# Patient Record
Sex: Female | Born: 1940 | Race: White | Hispanic: No | Marital: Married | Smoking: Former smoker
Health system: Southern US, Community
[De-identification: ages and names within clinical notes are randomized; demographics above are authoritative.]

## PROBLEM LIST (undated history)

## (undated) DIAGNOSIS — K602 Anal fissure, unspecified: Secondary | ICD-10-CM

## (undated) DIAGNOSIS — M1990 Unspecified osteoarthritis, unspecified site: Secondary | ICD-10-CM

## (undated) DIAGNOSIS — R5381 Other malaise: Secondary | ICD-10-CM

## (undated) DIAGNOSIS — M150 Primary generalized (osteo)arthritis: Secondary | ICD-10-CM

## (undated) DIAGNOSIS — G4700 Insomnia, unspecified: Secondary | ICD-10-CM

## (undated) DIAGNOSIS — I10 Essential (primary) hypertension: Secondary | ICD-10-CM

## (undated) DIAGNOSIS — E785 Hyperlipidemia, unspecified: Secondary | ICD-10-CM

## (undated) DIAGNOSIS — M5136 Other intervertebral disc degeneration, lumbar region with discogenic back pain only: Secondary | ICD-10-CM

## (undated) DIAGNOSIS — M8580 Other specified disorders of bone density and structure, unspecified site: Secondary | ICD-10-CM

## (undated) DIAGNOSIS — I471 Supraventricular tachycardia, unspecified: Secondary | ICD-10-CM

## (undated) DIAGNOSIS — E559 Vitamin D deficiency, unspecified: Secondary | ICD-10-CM

## (undated) DIAGNOSIS — K219 Gastro-esophageal reflux disease without esophagitis: Secondary | ICD-10-CM

## (undated) DIAGNOSIS — F419 Anxiety disorder, unspecified: Secondary | ICD-10-CM

## (undated) DIAGNOSIS — N1832 Chronic kidney disease, stage 3b: Secondary | ICD-10-CM

## (undated) DIAGNOSIS — H269 Unspecified cataract: Secondary | ICD-10-CM

## (undated) DIAGNOSIS — N3010 Interstitial cystitis (chronic) without hematuria: Secondary | ICD-10-CM

## (undated) DIAGNOSIS — J4520 Mild intermittent asthma, uncomplicated: Secondary | ICD-10-CM

## (undated) DIAGNOSIS — R739 Hyperglycemia, unspecified: Secondary | ICD-10-CM

## (undated) HISTORY — DX: Hyperlipidemia, unspecified: E78.5

## (undated) HISTORY — DX: Essential (primary) hypertension: I10

## (undated) HISTORY — DX: Primary generalized (osteo)arthritis: M15.0

## (undated) HISTORY — DX: Anal fissure, unspecified: K60.2

## (undated) HISTORY — DX: Hyperglycemia, unspecified: R73.9

## (undated) HISTORY — PX: ESOPHAGOGASTRODUODENOSCOPY: SHX1529

## (undated) HISTORY — DX: Interstitial cystitis (chronic) without hematuria: N30.10

## (undated) HISTORY — DX: Unspecified cataract: H26.9

## (undated) HISTORY — DX: Insomnia, unspecified: G47.00

## (undated) HISTORY — PX: BREAST SURGERY: SHX581

## (undated) HISTORY — DX: Gastro-esophageal reflux disease without esophagitis: K21.9

## (undated) HISTORY — DX: Unspecified osteoarthritis, unspecified site: M19.90

## (undated) HISTORY — DX: Mild intermittent asthma, uncomplicated: J45.20

## (undated) HISTORY — DX: Anxiety disorder, unspecified: F41.9

## (undated) HISTORY — DX: Other intervertebral disc degeneration, lumbar region with discogenic back pain only: M51.360

## (undated) HISTORY — DX: Supraventricular tachycardia, unspecified: I47.10

## (undated) HISTORY — DX: Chronic kidney disease, stage 3b: N18.32

## (undated) HISTORY — DX: Other specified disorders of bone density and structure, unspecified site: M85.80

## (undated) HISTORY — DX: Other fatigue: R53.81

## (undated) HISTORY — DX: Vitamin D deficiency, unspecified: E55.9

---
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
## 1986-12-11 HISTORY — PX: ABDOMINAL HYSTERECTOMY: SHX81

## 1998-09-21 ENCOUNTER — Ambulatory Visit (HOSPITAL_BASED_OUTPATIENT_CLINIC_OR_DEPARTMENT_OTHER): Admission: RE | Admit: 1998-09-21 | Discharge: 1998-09-21 | Payer: Self-pay | Admitting: Urology

## 2002-06-18 ENCOUNTER — Encounter: Payer: Self-pay | Admitting: Urology

## 2002-06-18 ENCOUNTER — Encounter: Admission: RE | Admit: 2002-06-18 | Discharge: 2002-06-18 | Payer: Self-pay | Admitting: Urology

## 2011-02-24 ENCOUNTER — Encounter (INDEPENDENT_AMBULATORY_CARE_PROVIDER_SITE_OTHER): Payer: Self-pay | Admitting: *Deleted

## 2011-02-28 NOTE — Letter (Signed)
Summary: New Patient letter  [facility]  [address] [place], [place] [address]   Phone: [phone]  Fax: [phone]       02/24/2011 MRN: [id number]  [place], [place]  [address]  Dear [name],  Welcome to the Gastroenterology Division at [facility].    You are scheduled to see Dr.  Fuller Plan on 04-06-11 at 10:15A.M. on the 3rd floor at [facility], [place] [employer].  We ask that you try to arrive at our office 15 minutes prior to your appointment time to allow for check-in.  We would like you to complete the enclosed self-administered evaluation form prior to your visit and bring it with you on the day of your appointment.  We will review it with you.  Also, please bring a complete list of all your medications or, if you prefer, bring the medication bottles and we will list them.  Please bring your insurance card so that we may make a copy of it.  If your insurance requires a referral to see a specialist, please bring your referral form from your primary care physician.  Co-payments are due at the time of your visit and may be paid by cash, check or credit card.     Your office visit will consist of a consult with your physician (includes a physical exam), any laboratory testing he/she may order, scheduling of any necessary diagnostic testing (e.g. x-ray, ultrasound, CT-scan), and scheduling of a procedure (e.g. Endoscopy, Colonoscopy) if required.  Please allow enough time on your schedule to allow for any/all of these possibilities.    If you cannot keep your appointment, please call [phone] to cancel or reschedule prior to your appointment date.  This allows [place] the opportunity to schedule an appointment for another patient in need of care.  If you do not cancel or reschedule by 5 p.m. the business day prior to your appointment date, you will be charged a $50.00 late cancellation/no-show fee.    Thank you for choosing [facility] for your medical needs.  We appreciate the opportunity to care for you.  Please visit [place] at our website  to learn more about our practice.                     Sincerely,                                                             The Gastroenterology Division

## 2011-04-06 ENCOUNTER — Encounter: Payer: Self-pay | Admitting: Gastroenterology

## 2011-04-06 ENCOUNTER — Ambulatory Visit (INDEPENDENT_AMBULATORY_CARE_PROVIDER_SITE_OTHER): Payer: Medicare Other | Admitting: Gastroenterology

## 2011-04-06 VITALS — BP 118/72 | HR 72 | Ht 61.0 in | Wt 153.4 lb

## 2011-04-06 DIAGNOSIS — K219 Gastro-esophageal reflux disease without esophagitis: Secondary | ICD-10-CM

## 2011-04-06 DIAGNOSIS — Z1211 Encounter for screening for malignant neoplasm of colon: Secondary | ICD-10-CM

## 2011-04-06 NOTE — Patient Instructions (Signed)
You have been scheduled for a Upper Endoscopy and separate instruction sheet given.  Patient advised to avoid spicy, acidic, citrus, chocolate, mints, fruit and fruit juices.  Limit the intake of caffeine, alcohol and Soda.  Don't exercise too soon after eating.  Don't lie down within 3-4 hours of eating.  Elevate the head of your bed. Colon caner and polyp brochure given.  Cc: Gilford Rile, MD

## 2011-04-06 NOTE — Progress Notes (Signed)
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 She relates no prior problems with reflux until approximately 6 months ago when she noted frequent heartburn and regurgitation. Her symptoms worsened after Christmas and she started Prilosec daily and then twice daily without good control of symptoms. She was recently placed on pantoprazole 40 mg daily and her symptoms have come under excellent control. She denies dysphagia, odynophagia, chest pain, weight loss, nausea, vomiting, abdominal pain, melena, hematochezia, change in stool caliber. Her last colonoscopy was in 1999 and was normal.  Past Medical History  Diagnosis Date  . GERD (gastroesophageal reflux disease)   . Hypertension   . Interstitial cystitis    Past Surgical History  Procedure Date  . Appendectomy 1947  . Abdominal hysterectomy 1988  . Breast surgery     benign tumor removed    reports that she has never smoked. She has never used smokeless tobacco. She reports that she drinks alcohol. She reports that she does not use illicit drugs. family history includes Colon cancer in her paternal grandfather; Colon polyps in her father; and Heart disease in her mother. No Known Allergies  Outpatient Encounter Prescriptions as of 04/06/2011  Medication Sig Dispense Refill  . estradiol (ESTRACE) 0.1 MG/GM vaginal cream Place 2 g vaginally as needed.        . hydrochlorothiazide (,MICROZIDE/HYDRODIURIL,) 12.5 MG capsule Take 12.5 mg by mouth daily.        Marland Kitchen lisinopril (PRINIVIL,ZESTRIL) 5 MG tablet Take 5 mg by mouth daily.        . pantoprazole (PROTONIX) 40 MG tablet Take 40 mg by mouth daily.        Marland Kitchen PARoxetine (PAXIL-CR) 12.5 MG 24 hr tablet Take 12.5 mg by mouth every morning.         Review of Systems: Pertinent positive and negative review of systems were noted in the above HPI section. All other review of systems were otherwise negative.  Physical  Exam: General: Well developed , well nourished, no acute distress Head: Normocephalic and atraumatic Eyes:  sclerae anicteric, EOMI Ears: Normal auditory acuity Mouth: No deformity or lesions Neck: Supple, no masses or thyromegaly Lungs: Clear throughout to auscultation Heart: Regular rate and rhythm; no murmurs, rubs or bruits Abdomen: Soft, non tender and non distended. No masses, hepatosplenomegaly or hernias noted. Normal Bowel sounds Musculoskeletal: Symmetrical with no gross deformities  Skin: No lesions on visible extremities Pulses:  Normal pulses noted Extremities: No clubbing, cyanosis, edema or deformities noted Neurological: Alert oriented x 4, grossly nonfocal Cervical Nodes:  No significant cervical adenopathy Inguinal Nodes: No significant inguinal adenopathy Psychological:  Alert and cooperative. Normal mood and affect  Assessment and Recommendations:  1. GERD. Rule out ulcer disease, esophagitis and less likely upper gastrointestinal tract neoplasms. Standard anti-reflux measures. Continue pantoprazole 40 mg every morning. The risks, benefits, and alternatives to endoscopy with possible biopsy and possible dilation were discussed with the patient and they consent to proceed.   2. Routine colorectal cancer screening. The patient is overdue for average risk colorectal cancer screening. She declines colonoscopy at this time and states she will reconsider in the future.

## 2011-04-07 ENCOUNTER — Encounter: Payer: Self-pay | Admitting: Gastroenterology

## 2011-05-04 ENCOUNTER — Other Ambulatory Visit: Payer: Medicare Other | Admitting: Gastroenterology

## 2011-06-06 ENCOUNTER — Other Ambulatory Visit: Payer: Medicare Other | Admitting: Gastroenterology

## 2011-07-14 ENCOUNTER — Ambulatory Visit (AMBULATORY_SURGERY_CENTER): Payer: Medicare Other | Admitting: Gastroenterology

## 2011-07-14 ENCOUNTER — Encounter: Payer: Self-pay | Admitting: Gastroenterology

## 2011-07-14 VITALS — BP 145/60 | HR 79 | Temp 98.9°F | Resp 16 | Ht 61.0 in | Wt 153.0 lb

## 2011-07-14 DIAGNOSIS — K2990 Gastroduodenitis, unspecified, without bleeding: Secondary | ICD-10-CM

## 2011-07-14 DIAGNOSIS — K219 Gastro-esophageal reflux disease without esophagitis: Secondary | ICD-10-CM

## 2011-07-14 DIAGNOSIS — K2940 Chronic atrophic gastritis without bleeding: Secondary | ICD-10-CM

## 2011-07-14 MED ORDER — SODIUM CHLORIDE 0.9 % IV SOLN: 500.0000 mL | INTRAVENOUS | Status: DC

## 2011-07-14 NOTE — Patient Instructions (Signed)
Please refer to your blue and neon green sheets for instructions regarding diet and activity for the rest of today.  You may resume your medications as you would normally take them.  

## 2011-07-17 ENCOUNTER — Telehealth: Payer: Self-pay | Admitting: *Deleted

## 2011-07-17 NOTE — Telephone Encounter (Signed)

## 2011-07-22 ENCOUNTER — Encounter: Payer: Self-pay | Admitting: Gastroenterology

## 2013-04-23 ENCOUNTER — Other Ambulatory Visit: Payer: Self-pay | Admitting: Internal Medicine

## 2013-04-23 DIAGNOSIS — Z1231 Encounter for screening mammogram for malignant neoplasm of breast: Secondary | ICD-10-CM

## 2013-05-15 ENCOUNTER — Ambulatory Visit: Payer: Medicare Other

## 2014-02-24 ENCOUNTER — Encounter: Payer: Self-pay | Admitting: Gastroenterology

## 2014-04-14 ENCOUNTER — Encounter: Payer: Medicare Other | Admitting: Gastroenterology

## 2014-05-08 ENCOUNTER — Ambulatory Visit (AMBULATORY_SURGERY_CENTER): Payer: Self-pay

## 2014-05-08 VITALS — Ht 60.0 in | Wt 149.0 lb

## 2014-05-08 DIAGNOSIS — Z800 Family history of malignant neoplasm of digestive organs: Secondary | ICD-10-CM

## 2014-05-08 MED ORDER — MOVIPREP 100 G PO SOLR: 1.0000 | Freq: Once | ORAL | Status: DC

## 2014-05-08 NOTE — Progress Notes (Signed)
No allergies to eggs or soy No home oxygen No past problems with anesthesia No diet/weight loss meds Has email  Emmi instructions given for colonoscopy

## 2014-05-22 ENCOUNTER — Encounter: Payer: Medicare Other | Admitting: Gastroenterology

## 2014-07-16 ENCOUNTER — Ambulatory Visit (AMBULATORY_SURGERY_CENTER): Payer: Medicare Other | Admitting: Gastroenterology

## 2014-07-16 ENCOUNTER — Encounter: Payer: Self-pay | Admitting: Gastroenterology

## 2014-07-16 VITALS — BP 131/78 | HR 66 | Temp 97.8°F | Resp 15 | Ht 60.0 in | Wt 149.0 lb

## 2014-07-16 DIAGNOSIS — Z1211 Encounter for screening for malignant neoplasm of colon: Secondary | ICD-10-CM

## 2014-07-16 MED ORDER — SODIUM CHLORIDE 0.9 % IV SOLN: 500.0000 mL | INTRAVENOUS | Status: DC

## 2014-07-16 NOTE — Progress Notes (Signed)
Report to PACU, RN, vss, BBS= Clear.  

## 2014-07-16 NOTE — Patient Instructions (Signed)
Impressions/recommendations:  Normal colon. Hemorrhoids (handout given)  YOU HAD AN ENDOSCOPIC PROCEDURE TODAY AT [facility]: Refer to the procedure report that was given to you for any specific questions about what was found during the examination.  If the procedure report does not answer your questions, please call your gastroenterologist to clarify.  If you requested that your care partner not be given the details of your procedure findings, then the procedure report has been included in a sealed envelope for you to review at your convenience later.  YOU SHOULD EXPECT: Some feelings of bloating in the abdomen. Passage of more gas than usual.  Walking can help get rid of the air that was put into your GI tract during the procedure and reduce the bloating. If you had a lower endoscopy (such as a colonoscopy or flexible sigmoidoscopy) you may notice spotting of blood in your stool or on the toilet paper. If you underwent a bowel prep for your procedure, then you may not have a normal bowel movement for a few days.  DIET: Your first meal following the procedure should be a light meal and then it is ok to progress to your normal diet.  A half-sandwich or bowl of soup is an example of a good first meal.  Heavy or fried foods are harder to digest and may make you feel nauseous or bloated.  Likewise meals heavy in dairy and vegetables can cause extra gas to form and this can also increase the bloating.  Drink plenty of fluids but you should avoid alcoholic beverages for 24 hours.  ACTIVITY: Your care partner should take you home directly after the procedure.  You should plan to take it easy, moving slowly for the rest of the day.  You can resume normal activity the day after the procedure however you should NOT DRIVE or use heavy machinery for 24 hours (because of the sedation medicines used during the test).    SYMPTOMS TO REPORT IMMEDIATELY: A gastroenterologist can be reached at any  hour.  During normal business hours, 8:30 AM to 5:00 PM Monday through Friday, call [phone].  After hours and on weekends, please call the GI answering service at [phone] who will take a message and have the physician on call contact you.   Following lower endoscopy (colonoscopy or flexible sigmoidoscopy):  Excessive amounts of blood in the stool  Significant tenderness or worsening of abdominal pains  Swelling of the abdomen that is new, acute  Fever of 100F or higher   FOLLOW UP: If any biopsies were taken you will be contacted by phone or by letter within the next 1-3 weeks.  Call your gastroenterologist if you have not heard about the biopsies in 3 weeks.  Our staff will call the home number listed on your records the next business day following your procedure to check on you and address any questions or concerns that you may have at that time regarding the information given to you following your procedure. This is a courtesy call and so if there is no answer at the home number and we have not heard from you through the emergency physician on call, we will assume that you have returned to your regular daily activities without incident.  SIGNATURES/CONFIDENTIALITY: You and/or your care partner have signed paperwork which will be entered into your electronic medical record.  These signatures attest to the fact that that the information above on your After Visit Summary has been reviewed and is  understood.  Full responsibility of the confidentiality of this discharge information lies with you and/or your care-partner.

## 2014-07-16 NOTE — Op Note (Signed)
[place]  [address] [place], [address]   COLONOSCOPY PROCEDURE REPORT  PATIENT: Jenna Poole, Jenna Poole  MR#: [id number] BIRTHDATE: 12/13/1940 , 73  yrs. old GENDER: Female ENDOSCOPIST: Ladene Artist, MD, [facility] REFERRED CN:OBSJ Bea [place], M.D. PROCEDURE DATE:  07/16/2014 PROCEDURE:   Colonoscopy, screening First Screening Colonoscopy - Avg.  risk and is 50 yrs.  old or older - No.  Prior Negative Screening - Now for repeat screening. 10 or more years since last screening  History of Adenoma - Now for follow-up colonoscopy & has been > or = to 3 yrs.  N/A  Polyps Removed Today? No.  Recommend repeat exam, <10 yrs? No. ASA CLASS:   Class II INDICATIONS:average risk screening. MEDICATIONS: MAC sedation, administered by CRNA and propofol (Diprivan) 322m IV DESCRIPTION OF PROCEDURE:   After the risks benefits and alternatives of the procedure were thoroughly explained, informed consent was obtained.  A digital rectal exam revealed no abnormalities of the rectum.   The LB [id number] endoscope was introduced through the anus and advanced to the cecum, which was identified by both the appendix and ileocecal valve. No adverse events experienced with a tortuous sigmoid colon, moderately difficulty advancing around the sigmoid colon.   The quality of the prep was good, using MoviPrep  The instrument was then slowly withdrawn as the colon was fully examined.  CLON FINDINGS: A normal appearing cecum, ileocecal valve, and appendiceal orifice were identified.  The ascending, hepatic flexure, transverse, splenic flexure, descending, sigmoid colon and rectum appeared unremarkable.  No polyps or cancers were seen. Retroflexed views revealed small external hemorrhoids. The time to cecum=8 minutes 25 seconds.  Withdrawal time=9 minutes 31 seconds. The scope was withdrawn and the procedure completed. COMPLICATIONS: There were no complications.  ENDOSCOPIC IMPRESSION: 1.   Normal colon 2.  Small external hemorrhoids  RECOMMENDATIONS: 1.  Given your age, you will not need another colonoscopy for colon cancer screening or polyp surveillance.  These types of tests usually stop around the age 73  eSigned:  MLadene Artist MD, [facility]08/05/2014 12:29 PM

## 2014-07-16 NOTE — Progress Notes (Signed)
Two open areas on right hand. Patient stated skin peeling.

## 2014-07-17 ENCOUNTER — Telehealth: Payer: Self-pay

## 2014-07-17 NOTE — Telephone Encounter (Signed)
  Follow up Call-  Call back number 07/16/2014  Post procedure Call Back phone  # [phone]  Permission to leave phone message Yes     Patient questions:  Do you have a fever, pain , or abdominal swelling? No. Pain Score  0 *  Have you tolerated food without any problems? Yes.    Have you been able to return to your normal activities? Yes.    Do you have any questions about your discharge instructions: Diet   No. Medications  No. Follow up visit  No.  Do you have questions or concerns about your Care? No.  Actions: * If pain score is 4 or above: No action needed, pain <4.  No problems per the pt. maw

## 2015-04-29 ENCOUNTER — Telehealth: Payer: Self-pay | Admitting: Gastroenterology

## 2015-04-29 NOTE — Telephone Encounter (Signed)
Patient with worsening heart burn and dysphagia.  Similar to the episodes she had in 2012.  She is scheduled to come in and see Dr. Fuller Plan on 05/04/15 3:00

## 2015-05-04 ENCOUNTER — Ambulatory Visit (INDEPENDENT_AMBULATORY_CARE_PROVIDER_SITE_OTHER): Payer: Medicare Other | Admitting: Gastroenterology

## 2015-05-04 ENCOUNTER — Encounter: Payer: Self-pay | Admitting: Gastroenterology

## 2015-05-04 VITALS — BP 136/68 | HR 68 | Ht 59.0 in | Wt 144.5 lb

## 2015-05-04 DIAGNOSIS — K219 Gastro-esophageal reflux disease without esophagitis: Secondary | ICD-10-CM

## 2015-05-04 MED ORDER — PANTOPRAZOLE SODIUM 40 MG PO TBEC: 40.0000 mg | DELAYED_RELEASE_TABLET | Freq: Two times a day (BID) | ORAL | Status: DC

## 2015-05-04 NOTE — Patient Instructions (Signed)
Increase your pantoprazole 40 mg to one tablet by mouth twice daily. A new prescription has been sent to your pharmacy.   Please call back in 4 weeks if your symptoms have not improved.   Thank you for choosing me and [facility].  Pricilla Riffle. Dagoberto Ligas., MD., Marval Regal

## 2015-05-04 NOTE — Progress Notes (Signed)
    History of Present Illness: This is a 74 year old female with a history of GERD. She states her reflux symptoms have worsened over the past few months despite remaining on pantoprazole 40 mg daily. She admits that she is not carefully follow antireflux diet. She notes it when she more closely follows a diet her symptoms improve and when she uses Zantac in the evening for second pantoprazole evening her symptoms improve. Colonoscopy 07/2014 showed only external hemorrhoids. EGD in 07/2011 showed gastritis.   Current Medications, Allergies, Past Medical History, Past Surgical History, Family History and Social History were reviewed in [employer] record.  Physical Exam: General: Well developed , well nourished, no acute distress Head: Normocephalic and atraumatic Eyes:  sclerae anicteric, EOMI Ears: Normal auditory acuity Mouth: No deformity or lesions Lungs: Clear throughout to auscultation Heart: Regular rate and rhythm; no murmurs, rubs or bruits Abdomen: Soft, non tender and non distended. No masses, hepatosplenomegaly or hernias noted. Normal Bowel sounds Musculoskeletal: Symmetrical with no gross deformities  Pulses:  Normal pulses noted Extremities: No clubbing, cyanosis, edema or deformities noted Neurological: Alert oriented x 4, grossly nonfocal Psychological:  Alert and cooperative. Normal mood and affect  Assessment and Recommendations:  1. GERD. Symptoms not adequately controlled. Intensify all antireflux measures. Increase pantoprazole to 40 mg twice a day for one month and if symptoms are under excellent control she can resume pantoprazole daily. Symptoms are not adequately controlled consider upper endoscopy for further evaluation.

## 2016-02-24 DIAGNOSIS — M8580 Other specified disorders of bone density and structure, unspecified site: Secondary | ICD-10-CM | POA: Insufficient documentation

## 2016-02-24 DIAGNOSIS — F419 Anxiety disorder, unspecified: Secondary | ICD-10-CM | POA: Insufficient documentation

## 2016-02-24 DIAGNOSIS — N183 Chronic kidney disease, stage 3 unspecified: Secondary | ICD-10-CM | POA: Insufficient documentation

## 2016-02-24 DIAGNOSIS — M159 Polyosteoarthritis, unspecified: Secondary | ICD-10-CM | POA: Insufficient documentation

## 2016-02-24 DIAGNOSIS — F5101 Primary insomnia: Secondary | ICD-10-CM

## 2016-02-24 DIAGNOSIS — E782 Mixed hyperlipidemia: Secondary | ICD-10-CM | POA: Insufficient documentation

## 2016-02-24 DIAGNOSIS — K2100 Gastro-esophageal reflux disease with esophagitis, without bleeding: Secondary | ICD-10-CM

## 2016-02-24 DIAGNOSIS — M5136 Other intervertebral disc degeneration, lumbar region: Secondary | ICD-10-CM | POA: Insufficient documentation

## 2016-02-24 DIAGNOSIS — R5381 Other malaise: Secondary | ICD-10-CM | POA: Insufficient documentation

## 2016-02-24 DIAGNOSIS — I10 Essential (primary) hypertension: Secondary | ICD-10-CM | POA: Insufficient documentation

## 2016-02-24 DIAGNOSIS — M51369 Other intervertebral disc degeneration, lumbar region without mention of lumbar back pain or lower extremity pain: Secondary | ICD-10-CM

## 2016-02-24 DIAGNOSIS — Z79899 Other long term (current) drug therapy: Secondary | ICD-10-CM

## 2016-02-24 HISTORY — DX: Other intervertebral disc degeneration, lumbar region without mention of lumbar back pain or lower extremity pain: M51.369

## 2016-02-24 HISTORY — DX: Other specified disorders of bone density and structure, unspecified site: M85.80

## 2016-02-24 HISTORY — DX: Anxiety disorder, unspecified: F41.9

## 2016-02-24 HISTORY — DX: Polyosteoarthritis, unspecified: M15.9

## 2016-02-24 HISTORY — DX: Essential (primary) hypertension: I10

## 2016-02-24 HISTORY — DX: Other long term (current) drug therapy: Z79.899

## 2016-02-24 HISTORY — DX: Chronic kidney disease, stage 3 unspecified: N18.30

## 2016-02-24 HISTORY — DX: Primary insomnia: F51.01

## 2016-02-24 HISTORY — DX: Gastro-esophageal reflux disease with esophagitis, without bleeding: K21.00

## 2016-02-24 HISTORY — DX: Mixed hyperlipidemia: E78.2

## 2016-02-28 DIAGNOSIS — J4520 Mild intermittent asthma, uncomplicated: Secondary | ICD-10-CM | POA: Insufficient documentation

## 2016-09-05 ENCOUNTER — Other Ambulatory Visit: Payer: Self-pay | Admitting: Internal Medicine

## 2016-09-05 DIAGNOSIS — M5136 Other intervertebral disc degeneration, lumbar region: Secondary | ICD-10-CM

## 2016-09-14 ENCOUNTER — Ambulatory Visit
Admission: RE | Admit: 2016-09-14 | Discharge: 2016-09-14 | Disposition: A | Discharge status: Home / Self Care | Payer: Medicare Other | Source: Ambulatory Visit | Attending: Internal Medicine | Admitting: Internal Medicine

## 2016-09-14 DIAGNOSIS — M5136 Other intervertebral disc degeneration, lumbar region: Secondary | ICD-10-CM

## 2016-09-14 MED ORDER — GADOBENATE DIMEGLUMINE 529 MG/ML IV SOLN
7.0000 mL | Freq: Once | INTRAVENOUS | Status: AC | PRN
  Administered 2016-09-14: 7 mL via INTRAVENOUS

## 2017-03-07 ENCOUNTER — Other Ambulatory Visit: Payer: Self-pay | Admitting: Neurological Surgery

## 2017-03-07 DIAGNOSIS — M5416 Radiculopathy, lumbar region: Secondary | ICD-10-CM

## 2017-03-16 ENCOUNTER — Ambulatory Visit
Admission: RE | Admit: 2017-03-16 | Discharge: 2017-03-16 | Disposition: A | Discharge status: Home / Self Care | Payer: Medicare Other | Source: Ambulatory Visit | Attending: Neurological Surgery | Admitting: Neurological Surgery

## 2017-03-16 DIAGNOSIS — M5416 Radiculopathy, lumbar region: Secondary | ICD-10-CM

## 2017-03-16 MED ORDER — MEPERIDINE HCL 100 MG/ML IJ SOLN: 50.0000 mg | Freq: Once | INTRAMUSCULAR | Status: DC

## 2017-03-16 MED ORDER — DIAZEPAM 5 MG PO TABS
5.0000 mg | ORAL_TABLET | Freq: Once | ORAL | Status: AC
  Administered 2017-03-16: 5 mg via ORAL

## 2017-03-16 MED ORDER — ONDANSETRON HCL 4 MG/2ML IJ SOLN: 4.0000 mg | Freq: Once | INTRAMUSCULAR | Status: DC

## 2017-03-16 MED ORDER — ONDANSETRON HCL 4 MG/2ML IJ SOLN: 4.0000 mg | Freq: Four times a day (QID) | INTRAMUSCULAR | Status: DC | PRN

## 2017-03-16 MED ORDER — IOPAMIDOL (ISOVUE-M 200) INJECTION 41%
15.0000 mL | Freq: Once | INTRAMUSCULAR | Status: AC
  Administered 2017-03-16: 15 mL via INTRATHECAL

## 2017-03-16 NOTE — Discharge Instructions (Signed)
Myelogram Discharge Instructions  1. Go home and rest quietly for the next 24 hours.  It is important to lie flat for the next 24 hours.  Get up only to go to the restroom.  You may lie in the bed or on a couch on your back, your stomach, your left side or your right side.  You may have one pillow under your head.  You may have pillows between your knees while you are on your side or under your knees while you are on your back.  2. DO NOT drive today.  Recline the seat as far back as it will go, while still wearing your seat belt, on the way home.  3. You may get up to go to the bathroom as needed.  You may sit up for 10 minutes to eat.  You may resume your normal diet and medications unless otherwise indicated.  Drink lots of extra fluids today and tomorrow.  4. The incidence of headache, nausea, or vomiting is about 5% (one in 20 patients).  If you develop a headache, lie flat and drink plenty of fluids until the headache goes away.  Caffeinated beverages may be helpful.  If you develop severe nausea and vomiting or a headache that does not go away with flat bed rest, call [phone].  5. You may resume normal activities after your 24 hours of bed rest is over; however, do not exert yourself strongly or do any heavy lifting tomorrow. If when you get up you have a headache when standing, go back to bed and force fluids for another 24 hours.  6. Call your physician for a follow-up appointment.  The results of your myelogram will be sent directly to your physician by the following day.  7. If you have any questions or if complications develop after you arrive home, please call [phone].  Discharge instructions have been explained to the patient.  The patient, or the person responsible for the patient, fully understands these instructions.       May resume Paxil on March 17, 2017, after 9:30 am.

## 2017-04-05 ENCOUNTER — Other Ambulatory Visit: Payer: Self-pay | Admitting: Neurological Surgery

## 2017-04-10 ENCOUNTER — Encounter (HOSPITAL_COMMUNITY): Payer: Self-pay

## 2017-04-10 ENCOUNTER — Encounter (HOSPITAL_COMMUNITY)
Admission: RE | Admit: 2017-04-10 | Discharge: 2017-04-10 | Disposition: A | Discharge status: Home / Self Care | Payer: Medicare Other | Source: Ambulatory Visit | Attending: Neurological Surgery | Admitting: Neurological Surgery

## 2017-04-10 DIAGNOSIS — Z01818 Encounter for other preprocedural examination: Secondary | ICD-10-CM | POA: Diagnosis present

## 2017-04-10 DIAGNOSIS — M48061 Spinal stenosis, lumbar region without neurogenic claudication: Secondary | ICD-10-CM | POA: Insufficient documentation

## 2017-04-10 DIAGNOSIS — Z01812 Encounter for preprocedural laboratory examination: Secondary | ICD-10-CM | POA: Diagnosis not present

## 2017-04-10 DIAGNOSIS — Z01810 Encounter for preprocedural cardiovascular examination: Secondary | ICD-10-CM | POA: Insufficient documentation

## 2017-04-10 LAB — BASIC METABOLIC PANEL
Anion gap: 9 (ref 5–15)
BUN: 21 mg/dL — AB (ref 6–20)
CALCIUM: 9 mg/dL (ref 8.9–10.3)
CO2: 25 mmol/L (ref 22–32)
Chloride: 103 mmol/L (ref 101–111)
Creatinine, Ser: 1.28 mg/dL — ABNORMAL HIGH (ref 0.44–1.00)
GFR calc Af Amer: 46 mL/min — ABNORMAL LOW (ref >60)
GFR, EST NON AFRICAN AMERICAN: 39 mL/min — AB (ref >60)
GLUCOSE: 78 mg/dL (ref 65–99)
Potassium: 3.7 mmol/L (ref 3.5–5.1)
Sodium: 137 mmol/L (ref 135–145)

## 2017-04-10 LAB — PROTIME-INR
INR: 1.03
Prothrombin Time: 13.5 seconds (ref 11.4–15.2)

## 2017-04-10 LAB — CBC WITH DIFFERENTIAL/PLATELET
BASOS ABS: 0 10*3/uL (ref 0.0–0.1)
BASOS PCT: 0 %
EOS PCT: 1 %
Eosinophils Absolute: 0 10*3/uL (ref 0.0–0.7)
HCT: 41.2 % (ref 36.0–46.0)
Hemoglobin: 13.8 g/dL (ref 12.0–15.0)
Lymphocytes Relative: 19 %
Lymphs Abs: 1.6 10*3/uL (ref 0.7–4.0)
MCH: 30.5 pg (ref 26.0–34.0)
MCHC: 33.5 g/dL (ref 30.0–36.0)
MCV: 90.9 fL (ref 78.0–100.0)
MONO ABS: 1.2 10*3/uL — AB (ref 0.1–1.0)
MONOS PCT: 14 %
Neutro Abs: 5.9 10*3/uL (ref 1.7–7.7)
Neutrophils Relative %: 66 %
PLATELETS: 223 10*3/uL (ref 150–400)
RBC: 4.53 MIL/uL (ref 3.87–5.11)
RDW: 12.9 % (ref 11.5–15.5)
WBC: 8.8 10*3/uL (ref 4.0–10.5)

## 2017-04-10 LAB — SURGICAL PCR SCREEN
MRSA, PCR: NEGATIVE
Staphylococcus aureus: NEGATIVE

## 2017-04-10 NOTE — Pre-Procedure Instructions (Signed)
Kyaira Trantham Librizzi  04/10/2017      [facility] - [place], [place] [address] Phone: [phone] Fax: [place], [place] [address] Phone: [phone] Fax: [phone]    Your procedure is scheduled on   Thursday 04/19/17  Report to [facility] Admitting at 1120 A.M.  Call this number if you have problems the morning of surgery:  [phone]   Remember:  Do not eat food or drink liquids after midnight.  Take these medicines the morning of surgery with A SIP OF WATER  TYLENOL OR TYLENOL3 IF NEEDED, ALPRAZOLAM IF NEEDED, PAROXETINE(PAXIL)           (STOP 7 DAYS PRIOR TO SURGERY- ASPIRIN OR ASPIRIN PRODUCTS, IBUPROFEN/ ADVIL/ MOTRIN, GOODY POWDERS/ BC'S, HERBAL MEDICINES)   Do not wear jewelry, make-up or nail polish.  Do not wear lotions, powders, or perfumes, or deoderant.  Do not shave 48 hours prior to surgery.  Men may shave face and neck.  Do not bring valuables to the hospital.  [facility] is not responsible for any belongings or valuables.  Contacts, dentures or bridgework may not be worn into surgery.  Leave your suitcase in the car.  After surgery it may be brought to your room.  For patients admitted to the hospital, discharge time will be determined by your treatment team.  Patients discharged the day of surgery will not be allowed to drive home.   Name and phone number of your driver:    Special instructions:  [place] - Preparing for Surgery  Before surgery, you can play an important role.  Because skin is not sterile, your skin needs to be as free of germs as possible.  You can reduce the number of germs on you skin by washing with CHG (chlorahexidine gluconate) soap before surgery.  CHG is an antiseptic cleaner which kills germs and bonds with the skin to continue killing germs even after  washing.  Please DO NOT use if you have an allergy to CHG or antibacterial soaps.  If your skin becomes reddened/irritated stop using the CHG and inform your nurse when you arrive at Short Stay.  Do not shave (including legs and underarms) for at least 48 hours prior to the first CHG shower.  You may shave your face.  Please follow these instructions carefully:   1.  Shower with CHG Soap the night before surgery and the                                morning of Surgery.  2.  If you choose to wash your hair, wash your hair first as usual with your       normal shampoo.  3.  After you shampoo, rinse your hair and body thoroughly to remove the                      Shampoo.  4.  Use CHG as you would any other liquid soap.  You can apply chg directly       to the skin and wash gently with scrungie or a clean washcloth.  5.  Apply the CHG Soap to your body ONLY FROM THE NECK DOWN.        Do not use on open  wounds or open sores.  Avoid contact with your eyes,       ears, mouth and genitals (private parts).  Wash genitals (private parts)       with your normal soap.  6.  Wash thoroughly, paying special attention to the area where your surgery        will be performed.  7.  Thoroughly rinse your body with warm water from the neck down.  8.  DO NOT shower/wash with your normal soap after using and rinsing off       the CHG Soap.  9.  Pat yourself dry with a clean towel.            10.  Wear clean pajamas.            11.  Place clean sheets on your bed the night of your first shower and do not        sleep with pets.  Day of Surgery  Do not apply any lotions/deoderants the morning of surgery.  Please wear clean clothes to the hospital/surgery center.    Please read over the following fact sheets that you were given. MRSA Information and Surgical Site Infection Prevention

## 2017-04-10 NOTE — Progress Notes (Signed)
Patient stated she had echo years ago not sure where or when, "it was fine."   Patient does not see a cardiologist.  pcp- DR. GREG GRISSO IN [place].

## 2017-04-19 ENCOUNTER — Encounter (HOSPITAL_COMMUNITY): Payer: Self-pay | Admitting: Neurological Surgery

## 2017-04-19 ENCOUNTER — Encounter (HOSPITAL_COMMUNITY)
Admission: RE | Disposition: A | Discharge status: Home / Self Care | Payer: Self-pay | Source: Ambulatory Visit | Attending: Neurological Surgery

## 2017-04-19 ENCOUNTER — Ambulatory Visit (HOSPITAL_COMMUNITY): Payer: Medicare Other

## 2017-04-19 ENCOUNTER — Ambulatory Visit (HOSPITAL_COMMUNITY): Payer: Medicare Other | Admitting: Certified Registered"

## 2017-04-19 ENCOUNTER — Observation Stay (HOSPITAL_COMMUNITY)
Admission: RE | Admit: 2017-04-19 | Discharge: 2017-04-20 | Disposition: A | Discharge status: Home / Self Care | Payer: Medicare Other | Source: Ambulatory Visit | Attending: Neurological Surgery | Admitting: Neurological Surgery

## 2017-04-19 DIAGNOSIS — Z79899 Other long term (current) drug therapy: Secondary | ICD-10-CM | POA: Diagnosis not present

## 2017-04-19 DIAGNOSIS — Z800 Family history of malignant neoplasm of digestive organs: Secondary | ICD-10-CM | POA: Insufficient documentation

## 2017-04-19 DIAGNOSIS — Z9049 Acquired absence of other specified parts of digestive tract: Secondary | ICD-10-CM | POA: Diagnosis not present

## 2017-04-19 DIAGNOSIS — Z7951 Long term (current) use of inhaled steroids: Secondary | ICD-10-CM | POA: Insufficient documentation

## 2017-04-19 DIAGNOSIS — I10 Essential (primary) hypertension: Secondary | ICD-10-CM | POA: Insufficient documentation

## 2017-04-19 DIAGNOSIS — K219 Gastro-esophageal reflux disease without esophagitis: Secondary | ICD-10-CM | POA: Diagnosis not present

## 2017-04-19 DIAGNOSIS — F419 Anxiety disorder, unspecified: Secondary | ICD-10-CM | POA: Diagnosis not present

## 2017-04-19 DIAGNOSIS — E785 Hyperlipidemia, unspecified: Secondary | ICD-10-CM | POA: Insufficient documentation

## 2017-04-19 DIAGNOSIS — M48061 Spinal stenosis, lumbar region without neurogenic claudication: Principal | ICD-10-CM | POA: Insufficient documentation

## 2017-04-19 DIAGNOSIS — Z87891 Personal history of nicotine dependence: Secondary | ICD-10-CM | POA: Diagnosis not present

## 2017-04-19 DIAGNOSIS — Z8371 Family history of colonic polyps: Secondary | ICD-10-CM | POA: Diagnosis not present

## 2017-04-19 DIAGNOSIS — Z9889 Other specified postprocedural states: Secondary | ICD-10-CM

## 2017-04-19 DIAGNOSIS — Z8249 Family history of ischemic heart disease and other diseases of the circulatory system: Secondary | ICD-10-CM | POA: Insufficient documentation

## 2017-04-19 DIAGNOSIS — Z90710 Acquired absence of both cervix and uterus: Secondary | ICD-10-CM | POA: Insufficient documentation

## 2017-04-19 DIAGNOSIS — M5126 Other intervertebral disc displacement, lumbar region: Secondary | ICD-10-CM | POA: Insufficient documentation

## 2017-04-19 DIAGNOSIS — G4700 Insomnia, unspecified: Secondary | ICD-10-CM | POA: Diagnosis not present

## 2017-04-19 DIAGNOSIS — Z791 Long term (current) use of non-steroidal anti-inflammatories (NSAID): Secondary | ICD-10-CM | POA: Insufficient documentation

## 2017-04-19 DIAGNOSIS — Z419 Encounter for procedure for purposes other than remedying health state, unspecified: Secondary | ICD-10-CM

## 2017-04-19 HISTORY — DX: Other specified postprocedural states: Z98.890

## 2017-04-19 HISTORY — PX: LUMBAR LAMINECTOMY/DECOMPRESSION MICRODISCECTOMY: SHX5026

## 2017-04-19 SURGERY — LUMBAR LAMINECTOMY/DECOMPRESSION MICRODISCECTOMY 1 LEVEL
Anesthesia: General | Site: Spine Lumbar | Laterality: Left

## 2017-04-19 MED ORDER — PANTOPRAZOLE SODIUM 40 MG PO TBEC
40.0000 mg | DELAYED_RELEASE_TABLET | Freq: Every day | ORAL | Status: DC
  Administered 2017-04-20: 40 mg via ORAL
  Filled 2017-04-19: qty 1

## 2017-04-19 MED ORDER — LACTATED RINGERS IV SOLN
INTRAVENOUS | Status: DC
  Administered 2017-04-19 (×2): via INTRAVENOUS
  Administered 2017-04-19: 50 mL/h via INTRAVENOUS

## 2017-04-19 MED ORDER — CEFAZOLIN SODIUM-DEXTROSE 2-4 GM/100ML-% IV SOLN
INTRAVENOUS | Status: AC
  Filled 2017-04-19: qty 100

## 2017-04-19 MED ORDER — EPHEDRINE SULFATE 50 MG/ML IJ SOLN
INTRAMUSCULAR | Status: DC | PRN
  Administered 2017-04-19 (×2): 10 mg via INTRAVENOUS

## 2017-04-19 MED ORDER — 0.9 % SODIUM CHLORIDE (POUR BTL) OPTIME
TOPICAL | Status: DC | PRN
  Administered 2017-04-19: 1000 mL

## 2017-04-19 MED ORDER — MORPHINE SULFATE (PF) 4 MG/ML IV SOLN: 2.0000 mg | INTRAVENOUS | Status: DC | PRN

## 2017-04-19 MED ORDER — ROCURONIUM BROMIDE 100 MG/10ML IV SOLN
INTRAVENOUS | Status: DC | PRN
  Administered 2017-04-19: 50 mg via INTRAVENOUS

## 2017-04-19 MED ORDER — HYDROCODONE-ACETAMINOPHEN 7.5-325 MG PO TABS
ORAL_TABLET | ORAL | Status: AC
Start: 2017-04-19 — End: 2017-04-20
  Filled 2017-04-19: qty 1

## 2017-04-19 MED ORDER — LOSARTAN POTASSIUM 50 MG PO TABS
50.0000 mg | ORAL_TABLET | Freq: Every day | ORAL | Status: DC
  Administered 2017-04-20: 50 mg via ORAL
  Filled 2017-04-19: qty 1

## 2017-04-19 MED ORDER — CHLORHEXIDINE GLUCONATE CLOTH 2 % EX PADS: 6.0000 | MEDICATED_PAD | Freq: Once | CUTANEOUS | Status: DC

## 2017-04-19 MED ORDER — CELECOXIB 200 MG PO CAPS
200.0000 mg | ORAL_CAPSULE | Freq: Two times a day (BID) | ORAL | Status: DC
  Administered 2017-04-19 – 2017-04-20 (×2): 200 mg via ORAL
  Filled 2017-04-19 (×2): qty 1

## 2017-04-19 MED ORDER — POTASSIUM CHLORIDE IN NACL 20-0.9 MEQ/L-% IV SOLN: INTRAVENOUS | Status: DC

## 2017-04-19 MED ORDER — SUGAMMADEX SODIUM 200 MG/2ML IV SOLN
INTRAVENOUS | Status: AC
  Filled 2017-04-19: qty 2

## 2017-04-19 MED ORDER — HYDROCHLOROTHIAZIDE 12.5 MG PO CAPS
12.5000 mg | ORAL_CAPSULE | Freq: Every day | ORAL | Status: DC
  Administered 2017-04-20: 12.5 mg via ORAL
  Filled 2017-04-19: qty 1

## 2017-04-19 MED ORDER — ZOLPIDEM TARTRATE 5 MG PO TABS: 5.0000 mg | ORAL_TABLET | Freq: Every evening | ORAL | Status: DC | PRN

## 2017-04-19 MED ORDER — DEXAMETHASONE SODIUM PHOSPHATE 10 MG/ML IJ SOLN
INTRAMUSCULAR | Status: AC
  Filled 2017-04-19: qty 1

## 2017-04-19 MED ORDER — HYDROCODONE-ACETAMINOPHEN 7.5-325 MG PO TABS
1.0000 | ORAL_TABLET | ORAL | Status: DC | PRN
  Administered 2017-04-20: 1 via ORAL
  Filled 2017-04-19: qty 1

## 2017-04-19 MED ORDER — BACITRACIN 50000 UNITS IM SOLR
INTRAMUSCULAR | Status: DC | PRN
  Administered 2017-04-19: 12:00:00

## 2017-04-19 MED ORDER — BUPIVACAINE HCL (PF) 0.25 % IJ SOLN
INTRAMUSCULAR | Status: AC
  Filled 2017-04-19: qty 30

## 2017-04-19 MED ORDER — EPHEDRINE 5 MG/ML INJ
INTRAVENOUS | Status: AC
  Filled 2017-04-19: qty 10

## 2017-04-19 MED ORDER — SUGAMMADEX SODIUM 200 MG/2ML IV SOLN
INTRAVENOUS | Status: DC | PRN
  Administered 2017-04-19: 200 mg via INTRAVENOUS

## 2017-04-19 MED ORDER — CEFAZOLIN SODIUM-DEXTROSE 2-4 GM/100ML-% IV SOLN
2.0000 g | INTRAVENOUS | Status: AC
  Administered 2017-04-19: 2 g via INTRAVENOUS

## 2017-04-19 MED ORDER — PHENYLEPHRINE HCL 10 MG/ML IJ SOLN
INTRAVENOUS | Status: DC | PRN
  Administered 2017-04-19: 50 ug/min via INTRAVENOUS

## 2017-04-19 MED ORDER — LIDOCAINE 2% (20 MG/ML) 5 ML SYRINGE
INTRAMUSCULAR | Status: AC
  Filled 2017-04-19: qty 5

## 2017-04-19 MED ORDER — PROPOFOL 10 MG/ML IV BOLUS
INTRAVENOUS | Status: AC
  Filled 2017-04-19: qty 20

## 2017-04-19 MED ORDER — ONDANSETRON HCL 4 MG/2ML IJ SOLN
INTRAMUSCULAR | Status: AC
  Filled 2017-04-19: qty 2

## 2017-04-19 MED ORDER — PROPOFOL 10 MG/ML IV BOLUS
INTRAVENOUS | Status: DC | PRN
  Administered 2017-04-19: 160 mg via INTRAVENOUS

## 2017-04-19 MED ORDER — ALPRAZOLAM 0.25 MG PO TABS: 0.2500 mg | ORAL_TABLET | Freq: Three times a day (TID) | ORAL | Status: DC | PRN

## 2017-04-19 MED ORDER — DIPHENHYDRAMINE HCL 25 MG PO CAPS
25.0000 mg | ORAL_CAPSULE | Freq: Four times a day (QID) | ORAL | Status: DC | PRN
  Administered 2017-04-19 – 2017-04-20 (×2): 25 mg via ORAL
  Filled 2017-04-19 (×2): qty 1

## 2017-04-19 MED ORDER — FENTANYL CITRATE (PF) 250 MCG/5ML IJ SOLN
INTRAMUSCULAR | Status: AC
  Filled 2017-04-19: qty 5

## 2017-04-19 MED ORDER — BUPIVACAINE HCL (PF) 0.25 % IJ SOLN
INTRAMUSCULAR | Status: DC | PRN
  Administered 2017-04-19: 10 mL

## 2017-04-19 MED ORDER — METHOCARBAMOL 500 MG PO TABS
500.0000 mg | ORAL_TABLET | Freq: Four times a day (QID) | ORAL | Status: DC | PRN
  Administered 2017-04-19 – 2017-04-20 (×3): 500 mg via ORAL
  Filled 2017-04-19 (×2): qty 1

## 2017-04-19 MED ORDER — SODIUM CHLORIDE 0.9% FLUSH
3.0000 mL | Freq: Two times a day (BID) | INTRAVENOUS | Status: DC
  Administered 2017-04-19: 3 mL via INTRAVENOUS

## 2017-04-19 MED ORDER — PAROXETINE HCL 10 MG PO TABS
10.0000 mg | ORAL_TABLET | Freq: Every day | ORAL | Status: DC
  Filled 2017-04-19: qty 1

## 2017-04-19 MED ORDER — FENTANYL CITRATE (PF) 100 MCG/2ML IJ SOLN: 25.0000 ug | INTRAMUSCULAR | Status: DC | PRN

## 2017-04-19 MED ORDER — PHENYLEPHRINE HCL 10 MG/ML IJ SOLN
INTRAMUSCULAR | Status: DC | PRN
  Administered 2017-04-19: 120 ug via INTRAVENOUS
  Administered 2017-04-19: 80 ug via INTRAVENOUS
  Administered 2017-04-19: 120 ug via INTRAVENOUS
  Administered 2017-04-19: 80 ug via INTRAVENOUS

## 2017-04-19 MED ORDER — ROCURONIUM BROMIDE 10 MG/ML (PF) SYRINGE
PREFILLED_SYRINGE | INTRAVENOUS | Status: AC
  Filled 2017-04-19: qty 5

## 2017-04-19 MED ORDER — CEFAZOLIN SODIUM-DEXTROSE 2-4 GM/100ML-% IV SOLN: 2.0000 g | Freq: Three times a day (TID) | INTRAVENOUS | Status: DC

## 2017-04-19 MED ORDER — ONDANSETRON HCL 4 MG PO TABS
4.0000 mg | ORAL_TABLET | Freq: Four times a day (QID) | ORAL | Status: DC | PRN
Start: 2017-04-19 — End: 2017-04-20

## 2017-04-19 MED ORDER — ONDANSETRON HCL 4 MG/2ML IJ SOLN
INTRAMUSCULAR | Status: DC | PRN
  Administered 2017-04-19: 4 mg via INTRAVENOUS

## 2017-04-19 MED ORDER — DIPHENHYDRAMINE HCL 50 MG/ML IJ SOLN
INTRAMUSCULAR | Status: DC | PRN
  Administered 2017-04-19: 25 mg via INTRAVENOUS

## 2017-04-19 MED ORDER — GELATIN ABSORBABLE MT POWD
OROMUCOSAL | Status: DC | PRN
  Administered 2017-04-19: 12:00:00 via TOPICAL

## 2017-04-19 MED ORDER — DEXAMETHASONE SODIUM PHOSPHATE 10 MG/ML IJ SOLN
10.0000 mg | INTRAMUSCULAR | Status: AC
  Administered 2017-04-19: 10 mg via INTRAVENOUS

## 2017-04-19 MED ORDER — METHOCARBAMOL 1000 MG/10ML IJ SOLN
500.0000 mg | Freq: Four times a day (QID) | INTRAMUSCULAR | Status: DC | PRN
  Filled 2017-04-19: qty 5

## 2017-04-19 MED ORDER — SODIUM CHLORIDE 0.9% FLUSH: 3.0000 mL | INTRAVENOUS | Status: DC | PRN

## 2017-04-19 MED ORDER — LIDOCAINE HCL (CARDIAC) 20 MG/ML IV SOLN
INTRAVENOUS | Status: DC | PRN
  Administered 2017-04-19: 100 mg via INTRATRACHEAL

## 2017-04-19 MED ORDER — METHOCARBAMOL 500 MG PO TABS
ORAL_TABLET | ORAL | Status: AC
  Filled 2017-04-19: qty 1

## 2017-04-19 MED ORDER — DIPHENHYDRAMINE HCL 50 MG/ML IJ SOLN: 25.0000 mg | Freq: Four times a day (QID) | INTRAMUSCULAR | Status: DC | PRN

## 2017-04-19 MED ORDER — DIPHENHYDRAMINE HCL 25 MG PO CAPS: 25.0000 mg | ORAL_CAPSULE | Freq: Four times a day (QID) | ORAL | Status: DC | PRN

## 2017-04-19 MED ORDER — HYDROCODONE-ACETAMINOPHEN 7.5-325 MG PO TABS
1.0000 | ORAL_TABLET | Freq: Four times a day (QID) | ORAL | Status: DC
  Administered 2017-04-19 (×2): 1 via ORAL
  Filled 2017-04-19: qty 1

## 2017-04-19 MED ORDER — FENTANYL CITRATE (PF) 250 MCG/5ML IJ SOLN
INTRAMUSCULAR | Status: DC | PRN
  Administered 2017-04-19: 50 ug via INTRAVENOUS
  Administered 2017-04-19: 25 ug via INTRAVENOUS
  Administered 2017-04-19: 150 ug via INTRAVENOUS
  Administered 2017-04-19: 25 ug via INTRAVENOUS

## 2017-04-19 MED ORDER — ACETAMINOPHEN 325 MG PO TABS: 650.0000 mg | ORAL_TABLET | ORAL | Status: DC | PRN

## 2017-04-19 MED ORDER — THROMBIN 5000 UNITS EX SOLR
CUTANEOUS | Status: DC | PRN
  Administered 2017-04-19 (×2): 5000 [IU] via TOPICAL

## 2017-04-19 MED ORDER — THROMBIN 5000 UNITS EX SOLR
CUTANEOUS | Status: AC
  Filled 2017-04-19: qty 15000

## 2017-04-19 MED ORDER — DIPHENHYDRAMINE HCL 50 MG/ML IJ SOLN
25.0000 mg | Freq: Four times a day (QID) | INTRAMUSCULAR | Status: DC | PRN
  Filled 2017-04-19: qty 0.5

## 2017-04-19 MED ORDER — FLUTICASONE PROPIONATE 50 MCG/ACT NA SUSP
1.0000 | Freq: Every day | NASAL | Status: DC | PRN
  Filled 2017-04-19: qty 16

## 2017-04-19 MED ORDER — PHENOL 1.4 % MT LIQD: 1.0000 | OROMUCOSAL | Status: DC | PRN

## 2017-04-19 MED ORDER — NEOSTIGMINE METHYLSULFATE 5 MG/5ML IV SOSY
PREFILLED_SYRINGE | INTRAVENOUS | Status: AC
  Filled 2017-04-19: qty 5

## 2017-04-19 MED ORDER — ONDANSETRON HCL 4 MG/2ML IJ SOLN: 4.0000 mg | Freq: Four times a day (QID) | INTRAMUSCULAR | Status: DC | PRN

## 2017-04-19 MED ORDER — LOSARTAN POTASSIUM-HCTZ 50-12.5 MG PO TABS: 1.0000 | ORAL_TABLET | Freq: Every day | ORAL | Status: DC

## 2017-04-19 MED ORDER — ACETAMINOPHEN 650 MG RE SUPP: 650.0000 mg | RECTAL | Status: DC | PRN

## 2017-04-19 MED ORDER — SENNA 8.6 MG PO TABS
1.0000 | ORAL_TABLET | Freq: Two times a day (BID) | ORAL | Status: DC
  Administered 2017-04-19 – 2017-04-20 (×2): 8.6 mg via ORAL
  Filled 2017-04-19 (×2): qty 1

## 2017-04-19 MED ORDER — MENTHOL 3 MG MT LOZG: 1.0000 | LOZENGE | OROMUCOSAL | Status: DC | PRN

## 2017-04-19 MED ORDER — IBUPROFEN 200 MG PO TABS: 400.0000 mg | ORAL_TABLET | Freq: Two times a day (BID) | ORAL | Status: DC | PRN

## 2017-04-19 MED ORDER — PHENYLEPHRINE 40 MCG/ML (10ML) SYRINGE FOR IV PUSH (FOR BLOOD PRESSURE SUPPORT)
PREFILLED_SYRINGE | INTRAVENOUS | Status: AC
  Filled 2017-04-19: qty 10

## 2017-04-19 MED ORDER — ARTIFICIAL TEARS OPHTHALMIC OINT
TOPICAL_OINTMENT | OPHTHALMIC | Status: DC | PRN
  Administered 2017-04-19: 1 via OPHTHALMIC

## 2017-04-19 SURGICAL SUPPLY — 53 items
APL SKNCLS STERI-STRIP NONHPOA (GAUZE/BANDAGES/DRESSINGS) ×1
BAG DECANTER FOR FLEXI CONT (MISCELLANEOUS) ×3 IMPLANT
BENZOIN TINCTURE PRP APPL 2/3 (GAUZE/BANDAGES/DRESSINGS) ×3 IMPLANT
BUR MATCHSTICK NEURO 3.0 LAGG (BURR) ×3 IMPLANT
CANISTER SUCT 3000ML PPV (MISCELLANEOUS) ×3 IMPLANT
CARTRIDGE OIL MAESTRO DRILL (MISCELLANEOUS) ×1 IMPLANT
CLOSURE WOUND 1/2 X4 (GAUZE/BANDAGES/DRESSINGS) ×1
DIFFUSER DRILL AIR PNEUMATIC (MISCELLANEOUS) ×3 IMPLANT
DRAPE LAPAROTOMY 100X72X124 (DRAPES) ×3 IMPLANT
DRAPE MICROSCOPE LEICA (MISCELLANEOUS) ×3 IMPLANT
DRAPE POUCH INSTRU U-SHP 10X18 (DRAPES) ×3 IMPLANT
DRAPE SURG 17X23 STRL (DRAPES) ×3 IMPLANT
DRSG OPSITE POSTOP 3X4 (GAUZE/BANDAGES/DRESSINGS) ×2 IMPLANT
DURAPREP 26ML APPLICATOR (WOUND CARE) ×3 IMPLANT
ELECT REM PT RETURN 9FT ADLT (ELECTROSURGICAL) ×3
ELECTRODE REM PT RTRN 9FT ADLT (ELECTROSURGICAL) ×1 IMPLANT
GAUZE SPONGE 4X4 16PLY XRAY LF (GAUZE/BANDAGES/DRESSINGS) IMPLANT
GLOVE BIO SURGEON STRL SZ7 (GLOVE) IMPLANT
GLOVE BIO SURGEON STRL SZ8 (GLOVE) ×5 IMPLANT
GLOVE BIOGEL PI IND STRL 7.0 (GLOVE) IMPLANT
GLOVE BIOGEL PI IND STRL 7.5 (GLOVE) IMPLANT
GLOVE BIOGEL PI IND STRL 8 (GLOVE) IMPLANT
GLOVE BIOGEL PI INDICATOR 7.0 (GLOVE)
GLOVE BIOGEL PI INDICATOR 7.5 (GLOVE) ×2
GLOVE BIOGEL PI INDICATOR 8 (GLOVE) ×2
GLOVE SS N UNI LF 6.5 STRL (GLOVE) ×4 IMPLANT
GLOVE SS N UNI LF 7.0 STRL (GLOVE) ×4 IMPLANT
GOWN STRL REUS W/ TWL LRG LVL3 (GOWN DISPOSABLE) IMPLANT
GOWN STRL REUS W/ TWL XL LVL3 (GOWN DISPOSABLE) ×1 IMPLANT
GOWN STRL REUS W/TWL 2XL LVL3 (GOWN DISPOSABLE) IMPLANT
GOWN STRL REUS W/TWL LRG LVL3 (GOWN DISPOSABLE) ×3
GOWN STRL REUS W/TWL XL LVL3 (GOWN DISPOSABLE) ×9
HEMOSTAT POWDER KIT SURGIFOAM (HEMOSTASIS) ×2 IMPLANT
KIT BASIN OR (CUSTOM PROCEDURE TRAY) ×3 IMPLANT
KIT ROOM TURNOVER OR (KITS) ×3 IMPLANT
NDL HYPO 25X1 1.5 SAFETY (NEEDLE) ×1 IMPLANT
NDL SPNL 20GX3.5 QUINCKE YW (NEEDLE) IMPLANT
NEEDLE HYPO 25X1 1.5 SAFETY (NEEDLE) ×3 IMPLANT
NEEDLE SPNL 20GX3.5 QUINCKE YW (NEEDLE) IMPLANT
NS IRRIG 1000ML POUR BTL (IV SOLUTION) ×3 IMPLANT
OIL CARTRIDGE MAESTRO DRILL (MISCELLANEOUS) ×3
PACK LAMINECTOMY NEURO (CUSTOM PROCEDURE TRAY) ×3 IMPLANT
PAD ARMBOARD 7.5X6 YLW CONV (MISCELLANEOUS) ×9 IMPLANT
RUBBERBAND STERILE (MISCELLANEOUS) ×6 IMPLANT
SPONGE SURGIFOAM ABS GEL SZ50 (HEMOSTASIS) ×3 IMPLANT
STRIP CLOSURE SKIN 1/2X4 (GAUZE/BANDAGES/DRESSINGS) ×2 IMPLANT
SUT VIC AB 0 CT1 18XCR BRD8 (SUTURE) ×1 IMPLANT
SUT VIC AB 0 CT1 8-18 (SUTURE) ×3
SUT VIC AB 2-0 CP2 18 (SUTURE) ×3 IMPLANT
SUT VIC AB 3-0 SH 8-18 (SUTURE) ×3 IMPLANT
TOWEL GREEN STERILE (TOWEL DISPOSABLE) ×2 IMPLANT
TOWEL GREEN STERILE FF (TOWEL DISPOSABLE) ×3 IMPLANT
WATER STERILE IRR 1000ML POUR (IV SOLUTION) ×3 IMPLANT

## 2017-04-19 NOTE — Anesthesia Preprocedure Evaluation (Signed)
Anesthesia Evaluation  Patient identified by MRN, date of birth, ID band Patient awake    Reviewed: Allergy & Precautions, NPO status   Airway Mallampati: II  TM Distance: >3 FB     Dental   Pulmonary former smoker,    breath sounds clear to auscultation       Cardiovascular hypertension,  Rhythm:Regular Rate:Normal     Neuro/Psych    GI/Hepatic Neg liver ROS, GERD  ,  Endo/Other  negative endocrine ROS  Renal/GU negative Renal ROS     Musculoskeletal  (+) Arthritis ,   Abdominal   Peds  Hematology   Anesthesia Other Findings   Reproductive/Obstetrics                             Anesthesia Physical Anesthesia Plan  ASA: III  Anesthesia Plan: General   Post-op Pain Management:    Induction: Intravenous  Airway Management Planned: Oral ETT  Additional Equipment:   Intra-op Plan:   Post-operative Plan: Extubation in OR  Informed Consent: I have reviewed the patients History and Physical, chart, labs and discussed the procedure including the risks, benefits and alternatives for the proposed anesthesia with the patient or authorized representative who has indicated his/her understanding and acceptance.   Dental advisory given  Plan Discussed with: CRNA and Anesthesiologist  Anesthesia Plan Comments:         Anesthesia Quick Evaluation

## 2017-04-19 NOTE — H&P (Signed)
Subjective: Patient is a 76 y.o. female admitted for DLL L2-3. Onset of symptoms was several months ago, gradually worsening since that time.  The pain is rated severe, and is located at the across the lower back and radiates to L quad. The pain is described as aching and occurs all day. The symptoms have been progressive. Symptoms are exacerbated by exercise. MRI or CT showed HNP/ stenosis L2-3   Past Medical History:  Diagnosis Date  . Anal fissure   . Anxiety   . Arthritis   . GERD (gastroesophageal reflux disease)   . HLD (hyperlipidemia)   . Hypertension   . Insomnia   . Interstitial cystitis     Past Surgical History:  Procedure Laterality Date  . ABDOMINAL HYSTERECTOMY  1988  . APPENDECTOMY  1947  . BREAST SURGERY Right    benign cyst removed    Prior to Admission medications   Medication Sig Start Date End Date Taking? Authorizing Provider  acetaminophen (TYLENOL) 500 MG tablet Take 500 mg by mouth every 6 (six) hours as needed (for pain.).   Yes [provider]  acetaminophen-codeine (TYLENOL #3) 300-30 MG tablet Take 1 tablet by mouth 3 (three) times daily as needed for pain. 01/15/17  Yes [provider]  ALPRAZolam Duanne Moron) 0.5 MG tablet Take 0.25 mg by mouth every 8 (eight) hours as needed for anxiety.  10/12/16  Yes [provider]  atorvastatin (LIPITOR) 10 MG tablet Take 10 mg by mouth at bedtime.   Yes [provider]  fluticasone (FLONASE) 50 MCG/ACT nasal spray Place 1-2 sprays into both nostrils daily as needed for allergies.   Yes [provider]  ibuprofen (ADVIL,MOTRIN) 200 MG tablet Take 400 mg by mouth 2 (two) times daily as needed (for pain.).   Yes [provider]  losartan-hydrochlorothiazide (HYZAAR) 50-12.5 MG per tablet Take 1 tablet by mouth daily.   Yes [provider]  pantoprazole (PROTONIX) 40 MG tablet Take 40 mg by mouth daily.   Yes [provider]  PARoxetine (PAXIL) 10 MG tablet  Take 10 mg by mouth daily. 01/15/17  Yes [provider]  Polyvinyl Alcohol-Povidone (REFRESH OP) Place 1-2 drops into both eyes 3 (three) times daily as needed (for dry eyes/allergy eyes.).   Yes [provider]  zolpidem (AMBIEN) 10 MG tablet Take 5-10 mg by mouth at bedtime as needed for sleep.    Yes [provider]   Allergies  Allergen Reactions  . No Known Allergies     Social History  Substance Use Topics  . Smoking status: Former Smoker    Packs/day: 0.50    Years: 10.00    Types: Cigarettes    Quit date: 12/12/1967  . Smokeless tobacco: Never Used  . Alcohol use 0.6 oz/week    1 Glasses of wine per week     Comment: ocass wine    Family History  Problem Relation Age of Onset  . Colon polyps Father   . Heart disease Mother        a-fib  . Colon cancer Paternal Grandfather      Review of Systems  Positive ROS: neg  All other systems have been reviewed and were otherwise negative with the exception of those mentioned in the HPI and as above.  Objective: Vital signs in last 24 hours: Temp:  [98 F (36.7 C)] 98 F (36.7 C) (05/10 0954) Pulse Rate:  [66] 66 (05/10 0954) Resp:  [16] 16 (05/10 0954) SpO2:  [  98 %] 98 % (05/10 0954)  General Appearance: Alert, cooperative, no distress, appears stated age Head: Normocephalic, without obvious abnormality, atraumatic Eyes: PERRL, conjunctiva/corneas clear, EOM's intact    Neck: Supple, symmetrical, trachea midline Back: Symmetric, no curvature, ROM normal, no CVA tenderness Lungs:  respirations unlabored Heart: Regular rate and rhythm Abdomen: Soft, non-tender Extremities: Extremities normal, atraumatic, no cyanosis or edema Pulses: 2+ and symmetric all extremities Skin: Skin color, texture, turgor normal, no rashes or lesions  NEUROLOGIC:   Mental status: Alert and oriented x4,  no aphasia, good attention span, fund of knowledge, and memory Motor Exam - grossly normal Sensory Exam -  grossly normal Reflexes: trace Coordination - grossly normal Gait - grossly normal Balance - grossly normal Cranial Nerves: I: smell Not tested  II: visual acuity  OS: nl    OD: nl  II: visual fields Full to confrontation  II: pupils Equal, round, reactive to light  III,VII: ptosis None  III,IV,VI: extraocular muscles  Full ROM  V: mastication Normal  V: facial light touch sensation  Normal  V,VII: corneal reflex  Present  VII: facial muscle function - upper  Normal  VII: facial muscle function - lower Normal  VIII: hearing Not tested  IX: soft palate elevation  Normal  IX,X: gag reflex Present  XI: trapezius strength  5/5  XI: sternocleidomastoid strength 5/5  XI: neck flexion strength  5/5  XII: tongue strength  Normal    Data Review Lab Results  Component Value Date   WBC 8.8 04/10/2017   HGB 13.8 04/10/2017   HCT 41.2 04/10/2017   MCV 90.9 04/10/2017   PLT 223 04/10/2017   Lab Results  Component Value Date   NA 137 04/10/2017   K 3.7 04/10/2017   CL 103 04/10/2017   CO2 25 04/10/2017   BUN 21 (H) 04/10/2017   CREATININE 1.28 (H) 04/10/2017   GLUCOSE 78 04/10/2017   Lab Results  Component Value Date   INR 1.03 04/10/2017    Assessment/Plan: Patient admitted for L L2-3 DLL. Patient has failed a reasonable attempt at conservative therapy.  I explained the condition and procedure to the patient and answered any questions.  Patient wishes to proceed with procedure as planned. Understands risks/ benefits and typical outcomes of procedure.   Kareemah Grounds S 04/19/2017 11:01 AM

## 2017-04-19 NOTE — Op Note (Signed)
04/19/2017  12:28 PM  PATIENT:  Jenna Poole  76 y.o. 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 Vertell Limber  ANESTHESIA:   General  EBL: 20 ml  Total I/O In: 1000 [I.V.:1000] Out: 20 [Blood:20]  BLOOD ADMINISTERED: none  DRAINS: None  SPECIMEN:  none  INDICATION FOR PROCEDURE: This patient presented with severe left quadricep pain. Imaging showed left L3 nerve root compression on the myelogram. The patient tried conservative measures without relief. Pain was debilitating. Recommended left L2-3 hemilaminectomy. Patient understood the risks, benefits, and alternatives and potential outcomes and wished to proceed.  PROCEDURE DETAILS: The patient was taken to the operating room and after induction of adequate generalized endotracheal anesthesia, the patient was rolled into the prone position on the Wilson frame and all pressure points were padded. The lumbar region was cleaned and then prepped with DuraPrep and draped in the usual sterile fashion. 5 cc of local anesthesia was injected and then a dorsal midline incision was made and carried down to the lumbo sacral fascia. The fascia was opened and the paraspinous musculature was taken down in a subperiosteal fashion to expose L2-3 on the left. Intraoperative x-ray confirmed my level, and then I used a combination of the high-speed drill and the Kerrison punches to perform a hemilaminectomy, medial facetectomy, and foraminotomy at L2-3 on the left. The underlying yellow ligament was opened and removed in a piecemeal fashion to expose the underlying dura and exiting nerve root. I undercut the lateral recess and dissected down until I was medial to and distal to the pedicle. The nerve root was  well decompressed.  I then palpated with a coronary dilator along the nerve root and into the foramen to assure adequate decompression. I felt no more compression of the nerve root. I irrigated with saline solution containing bacitracin. Achieved hemostasis with bipolar cautery, lined the dura with Gelfoam, and then closed the fascia with 0 Vicryl. I closed the subcutaneous tissues with 2-0 Vicryl and the subcuticular tissues with 3-0 Vicryl. The skin was then closed with benzoin and Steri-Strips. The drapes were removed, a sterile dressing was applied. The patient was awakened from general anesthesia and transferred to the recovery room in stable condition. At the end of the procedure all sponge, needle and instrument counts were correct.    PLAN OF CARE: Admit for overnight observation  PATIENT DISPOSITION:  PACU - hemodynamically stable.   Delay start of Pharmacological VTE agent (>24hrs) due to surgical blood loss or risk of bleeding:  yes

## 2017-04-19 NOTE — Transfer of Care (Signed)
Immediate Anesthesia Transfer of Care Note  Patient: NATURE KUEKER  Procedure(s) Performed: Procedure(s): Left Lumbar Two-Three decompressive hemilaminectomy (Left)  Patient Location: PACU  Anesthesia Type:General  Level of Consciousness: awake, alert , oriented and patient cooperative  Airway & Oxygen Therapy: Patient Spontanous Breathing and Patient connected to nasal cannula oxygen  Post-op Assessment: Report given to RN and Post -op Vital signs reviewed and stable  Post vital signs: Reviewed and stable  Last Vitals:  Vitals:   04/19/17 0954 04/19/17 1304  Pulse: 66   Resp: 16   Temp: 36.7 C (P) 36.1 C    Last Pain:  Vitals:   04/19/17 1304  TempSrc:   PainSc: (P) Asleep      Patients Stated Pain Goal: 3 (27/63/94 3200)  Complications: No apparent anesthesia complications

## 2017-04-20 ENCOUNTER — Encounter (HOSPITAL_COMMUNITY): Payer: Self-pay | Admitting: Neurological Surgery

## 2017-04-20 DIAGNOSIS — M48061 Spinal stenosis, lumbar region without neurogenic claudication: Secondary | ICD-10-CM | POA: Diagnosis not present

## 2017-04-20 MED ORDER — ACETAMINOPHEN-CODEINE #3 300-30 MG PO TABS: 1.0000 | ORAL_TABLET | Freq: Four times a day (QID) | ORAL | 0 refills | Status: DC | PRN

## 2017-04-20 MED ORDER — DEXAMETHASONE 4 MG PO TABS
8.0000 mg | ORAL_TABLET | Freq: Once | ORAL | Status: AC
  Administered 2017-04-20: 8 mg via ORAL
  Filled 2017-04-20: qty 2

## 2017-04-20 NOTE — Progress Notes (Signed)
Patient alert and oriented, mae's well, voiding adequate amount of urine, swallowing without difficulty, no c/o pain at time of discharge. Patient discharged home with family. Script and discharged instructions given to patient. Patient and family stated understanding of instructions given. Patient has an appointment with Dr. Ronnald Ramp

## 2017-04-20 NOTE — Discharge Summary (Signed)
Physician Discharge Summary  Patient ID: Jenna Poole MRN: [id number] DOB/AGE: 08-09-1941 76 y.o.  Admit date: 04/19/2017 Discharge date: 04/20/2017  Admission Diagnoses: lumbar stenosis   Discharge Diagnoses: same   Discharged Condition: good  Hospital Course: The patient was admitted on 04/19/2017 and taken to the operating room where the patient underwent LL l2-3. The patient tolerated the procedure well and was taken to the recovery room and then to the floor in stable condition. The hospital course was routine. There were no complications. The wound remained clean dry and intact. Pt had appropriate back soreness. No complaints of leg pain or new N/T/W. The patient remained afebrile with stable vital signs, and tolerated a regular diet. The patient continued to increase activities, and pain was well controlled with oral pain medications.   Consults: none  Significant Diagnostic Studies:  Results for orders placed or performed during the hospital encounter of 04/10/17  Surgical pcr screen  Result Value Ref Range   MRSA, PCR NEGATIVE NEGATIVE   Staphylococcus aureus NEGATIVE NEGATIVE  Basic metabolic panel  Result Value Ref Range   Sodium 137 135 - 145 mmol/L   Potassium 3.7 3.5 - 5.1 mmol/L   Chloride 103 101 - 111 mmol/L   CO2 25 22 - 32 mmol/L   Glucose, Bld 78 65 - 99 mg/dL   BUN 21 (H) 6 - 20 mg/dL   Creatinine, Ser 1.28 (H) 0.44 - 1.00 mg/dL   Calcium 9.0 8.9 - 10.3 mg/dL   GFR calc non Af Amer 39 (L) >60 mL/min   GFR calc Af Amer 46 (L) >60 mL/min   Anion gap 9 5 - 15  CBC WITH DIFFERENTIAL  Result Value Ref Range   WBC 8.8 4.0 - 10.5 K/uL   RBC 4.53 3.87 - 5.11 MIL/uL   Hemoglobin 13.8 12.0 - 15.0 g/dL   HCT 41.2 36.0 - 46.0 %   MCV 90.9 78.0 - 100.0 fL   MCH 30.5 26.0 - 34.0 pg   MCHC 33.5 30.0 - 36.0 g/dL   RDW 12.9 11.5 - 15.5 %   Platelets 223 150 - 400 K/uL   Neutrophils Relative % 66 %   Neutro Abs 5.9 1.7 - 7.7 K/uL   Lymphocytes Relative 19 %    Lymphs Abs 1.6 0.7 - 4.0 K/uL   Monocytes Relative 14 %   Monocytes Absolute 1.2 (H) 0.1 - 1.0 K/uL   Eosinophils Relative 1 %   Eosinophils Absolute 0.0 0.0 - 0.7 K/uL   Basophils Relative 0 %   Basophils Absolute 0.0 0.0 - 0.1 K/uL  Protime-INR  Result Value Ref Range   Prothrombin Time 13.5 11.4 - 15.2 seconds   INR 1.03     Chest 2 View  Result Date: 04/10/2017 CLINICAL DATA:  L2-3 decompressive hemi laminectomy with possible discectomy. EXAM: CHEST  2 VIEW COMPARISON:  February 26, 2013 FINDINGS: The heart size and mediastinal contours are within normal limits. Both lungs are clear. The visualized skeletal structures are unremarkable. IMPRESSION: No active cardiopulmonary disease. Electronically Signed   By: Dorise Bullion III M.D   On: 04/10/2017 13:56   Dg Lumbar Spine 2-3 Views  Result Date: 04/19/2017 CLINICAL DATA:  Lumbar discectomy EXAM: LUMBAR SPINE - 2-3 VIEW COMPARISON:  CT myelogram 03/16/2017 FINDINGS: Image number 1 demonstrates a needle between the spinous process of L1 and L2. Image number 2 reveals a retractor posterior to the L2 pedicle at the level the lamina. IMPRESSION: Lumbar localization as above. Electronically Signed  By: Franchot Gallo M.D.   On: 04/19/2017 14:38    Antibiotics:  Anti-infectives    Start     Dose/Rate Route Frequency Ordered Stop   04/19/17 1630  ceFAZolin (ANCEF) IVPB 2g/100 mL premix  Status:  Discontinued     2 g 200 mL/hr over 30 Minutes Intravenous Every 8 hours 04/19/17 1626 04/19/17 1745   04/19/17 1201  bacitracin 50,000 Units in sodium chloride irrigation 0.9 % 500 mL irrigation  Status:  Discontinued       As needed 04/19/17 1201 04/19/17 1300   04/19/17 0938  ceFAZolin (ANCEF) 2-4 GM/100ML-% IVPB    Comments:  Forte, Lindsi   : cabinet override      04/19/17 0938 04/19/17 1120   04/19/17 0937  ceFAZolin (ANCEF) IVPB 2g/100 mL premix     2 g 200 mL/hr over 30 Minutes Intravenous On call to O.R. 04/19/17 0937 04/19/17 1150       Discharge Exam: Blood pressure (!) 142/70, pulse 70, temperature 98.4 F (36.9 C), temperature source Oral, resp. rate 18, SpO2 100 %. Neurologic: Grossly normal Dressing dry  Discharge Medications:   Allergies as of 04/20/2017      Reactions   Cefazolin Hives   04/15/17- hives and facial edema after preop dose      Medication List    TAKE these medications   acetaminophen 500 MG tablet Commonly known as:  TYLENOL Take 500 mg by mouth every 6 (six) hours as needed (for pain.).   acetaminophen-codeine 300-30 MG tablet Commonly known as:  TYLENOL #3 Take 1 tablet by mouth every 6 (six) hours as needed. What changed:  when to take this  reasons to take this   ALPRAZolam 0.5 MG tablet Commonly known as:  XANAX Take 0.25 mg by mouth every 8 (eight) hours as needed for anxiety.   atorvastatin 10 MG tablet Commonly known as:  LIPITOR Take 10 mg by mouth at bedtime.   fluticasone 50 MCG/ACT nasal spray Commonly known as:  FLONASE Place 1-2 sprays into both nostrils daily as needed for allergies.   ibuprofen 200 MG tablet Commonly known as:  ADVIL,MOTRIN Take 400 mg by mouth 2 (two) times daily as needed (for pain.).   losartan-hydrochlorothiazide 50-12.5 MG tablet Commonly known as:  HYZAAR Take 1 tablet by mouth daily.   pantoprazole 40 MG tablet Commonly known as:  PROTONIX Take 40 mg by mouth daily.   PARoxetine 10 MG tablet Commonly known as:  PAXIL Take 10 mg by mouth daily.   REFRESH OP Place 1-2 drops into both eyes 3 (three) times daily as needed (for dry eyes/allergy eyes.).   zolpidem 10 MG tablet Commonly known as:  AMBIEN Take 5-10 mg by mouth at bedtime as needed for sleep.       Disposition: home   Final Dx: LL L2-3  Discharge Instructions     Remove dressing in 72 hours    Complete by:  As directed    Call MD for:  difficulty breathing, headache or visual disturbances    Complete by:  As directed    Call MD for:  persistant nausea  and vomiting    Complete by:  As directed    Call MD for:  redness, tenderness, or signs of infection (pain, swelling, redness, odor or green/yellow discharge around incision site)    Complete by:  As directed    Call MD for:  severe uncontrolled pain    Complete by:  As directed  Call MD for:  temperature >100.4    Complete by:  As directed    Diet - low sodium heart healthy    Complete by:  As directed    Increase activity slowly    Complete by:  As directed       Follow-up Information    Eustace Moore, MD. Schedule an appointment as soon as possible for a visit in 2 week(s).   Specialty:  Neurosurgery Contact information: [address] [place] [address] [phone]            Signed: Eustace Moore 04/20/2017, 7:50 AM

## 2017-04-26 NOTE — Anesthesia Postprocedure Evaluation (Signed)
Anesthesia Post Note  Patient: Jenna Poole  Procedure(s) Performed: Procedure(s) (LRB): Left Lumbar Two-Three decompressive hemilaminectomy (Left)  Patient location during evaluation: PACU Anesthesia Type: General Level of consciousness: awake Pain management: pain level controlled Respiratory status: spontaneous breathing Cardiovascular status: stable Anesthetic complications: no       Last Vitals:  Vitals:   04/20/17 0300 04/20/17 0815  BP: (!) 142/70 139/81  Pulse: 70 79  Resp: 18 16  Temp: 36.9 C 37 C    Last Pain:  Vitals:   04/20/17 0300  TempSrc: Oral  PainSc:                  Sahory Nordling

## 2017-08-06 IMAGING — CR DG CHEST 2V
2 series · 2 of 2 positions shown · non-contrast
Comparison: February 26, 2013

CLINICAL DATA: L2-3 decompressive hemi laminectomy with possible
discectomy.

EXAM:
CHEST  2 VIEW

[w chest pa]
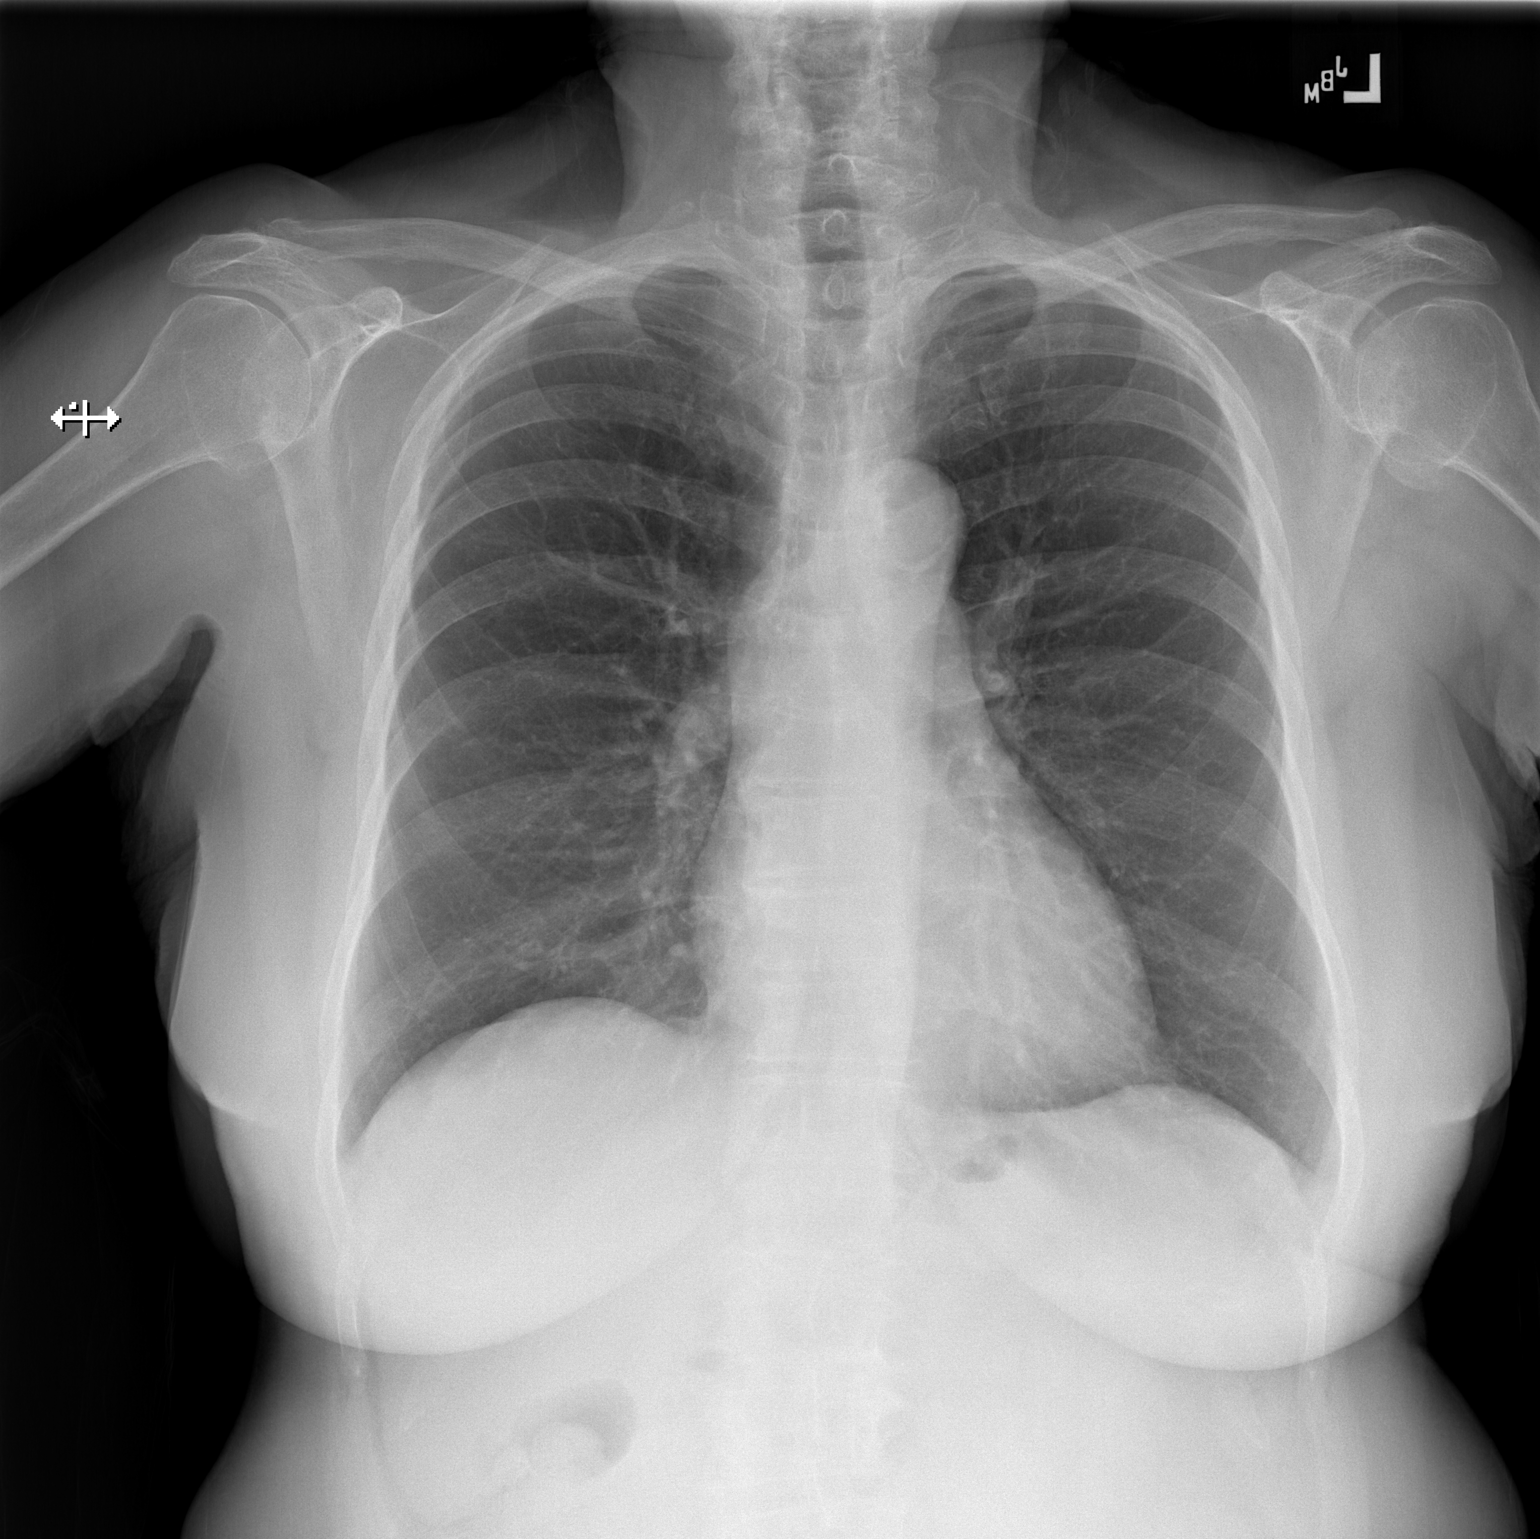

[w chest lat]
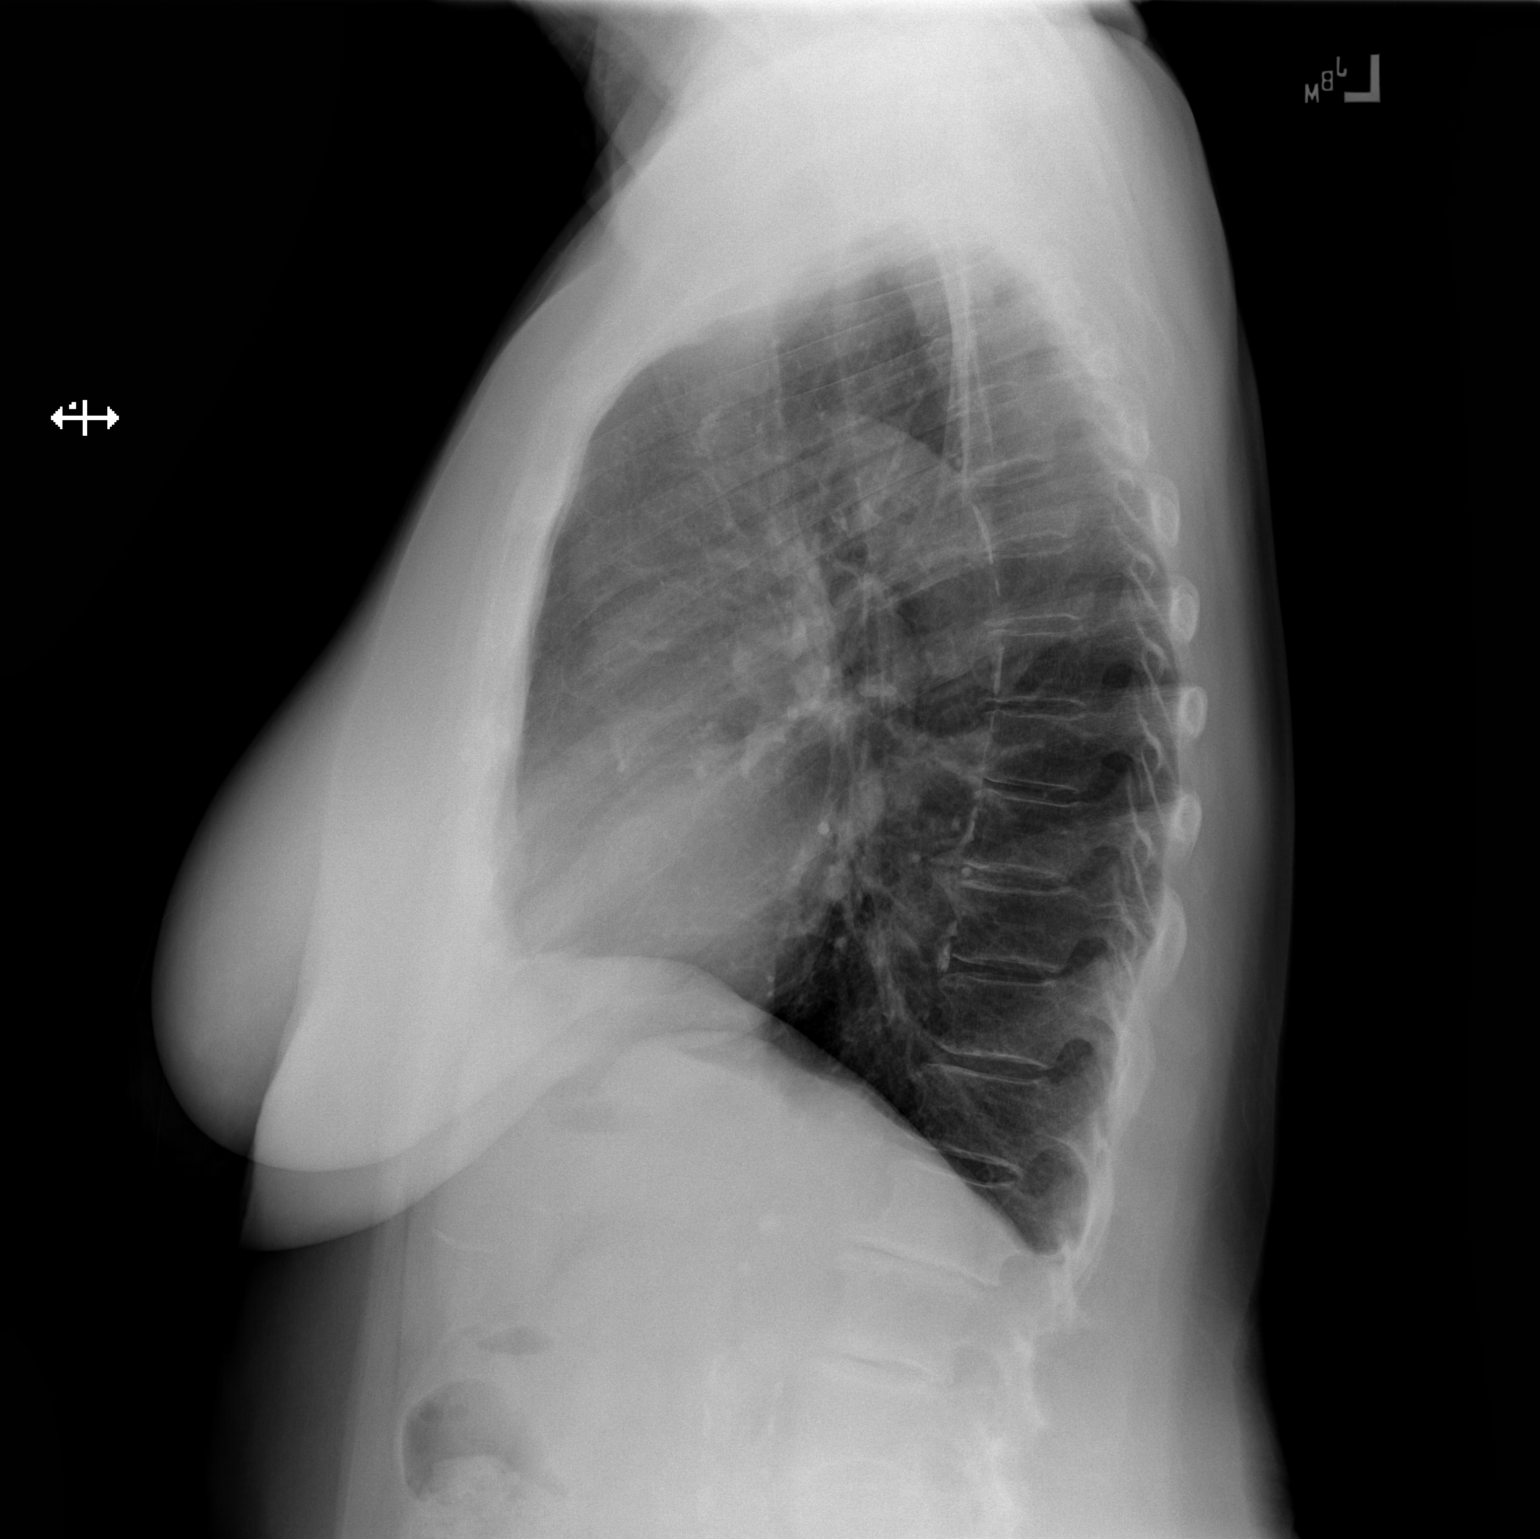

[2 of 2 positions shown; findings below may reference images not displayed]

FINDINGS: The heart size and mediastinal contours are within normal limits.
Both lungs are clear. The visualized skeletal structures are
unremarkable.
IMPRESSION: No active cardiopulmonary disease.

## 2017-08-15 IMAGING — CR DG LUMBAR SPINE 2-3V
2 series · 2 of 2 positions shown · non-contrast
Comparison: CT myelogram 03/16/2017

CLINICAL DATA: Lumbar discectomy

EXAM:
LUMBAR SPINE - 2-3 VIEW

[lateral (1 of 2)]
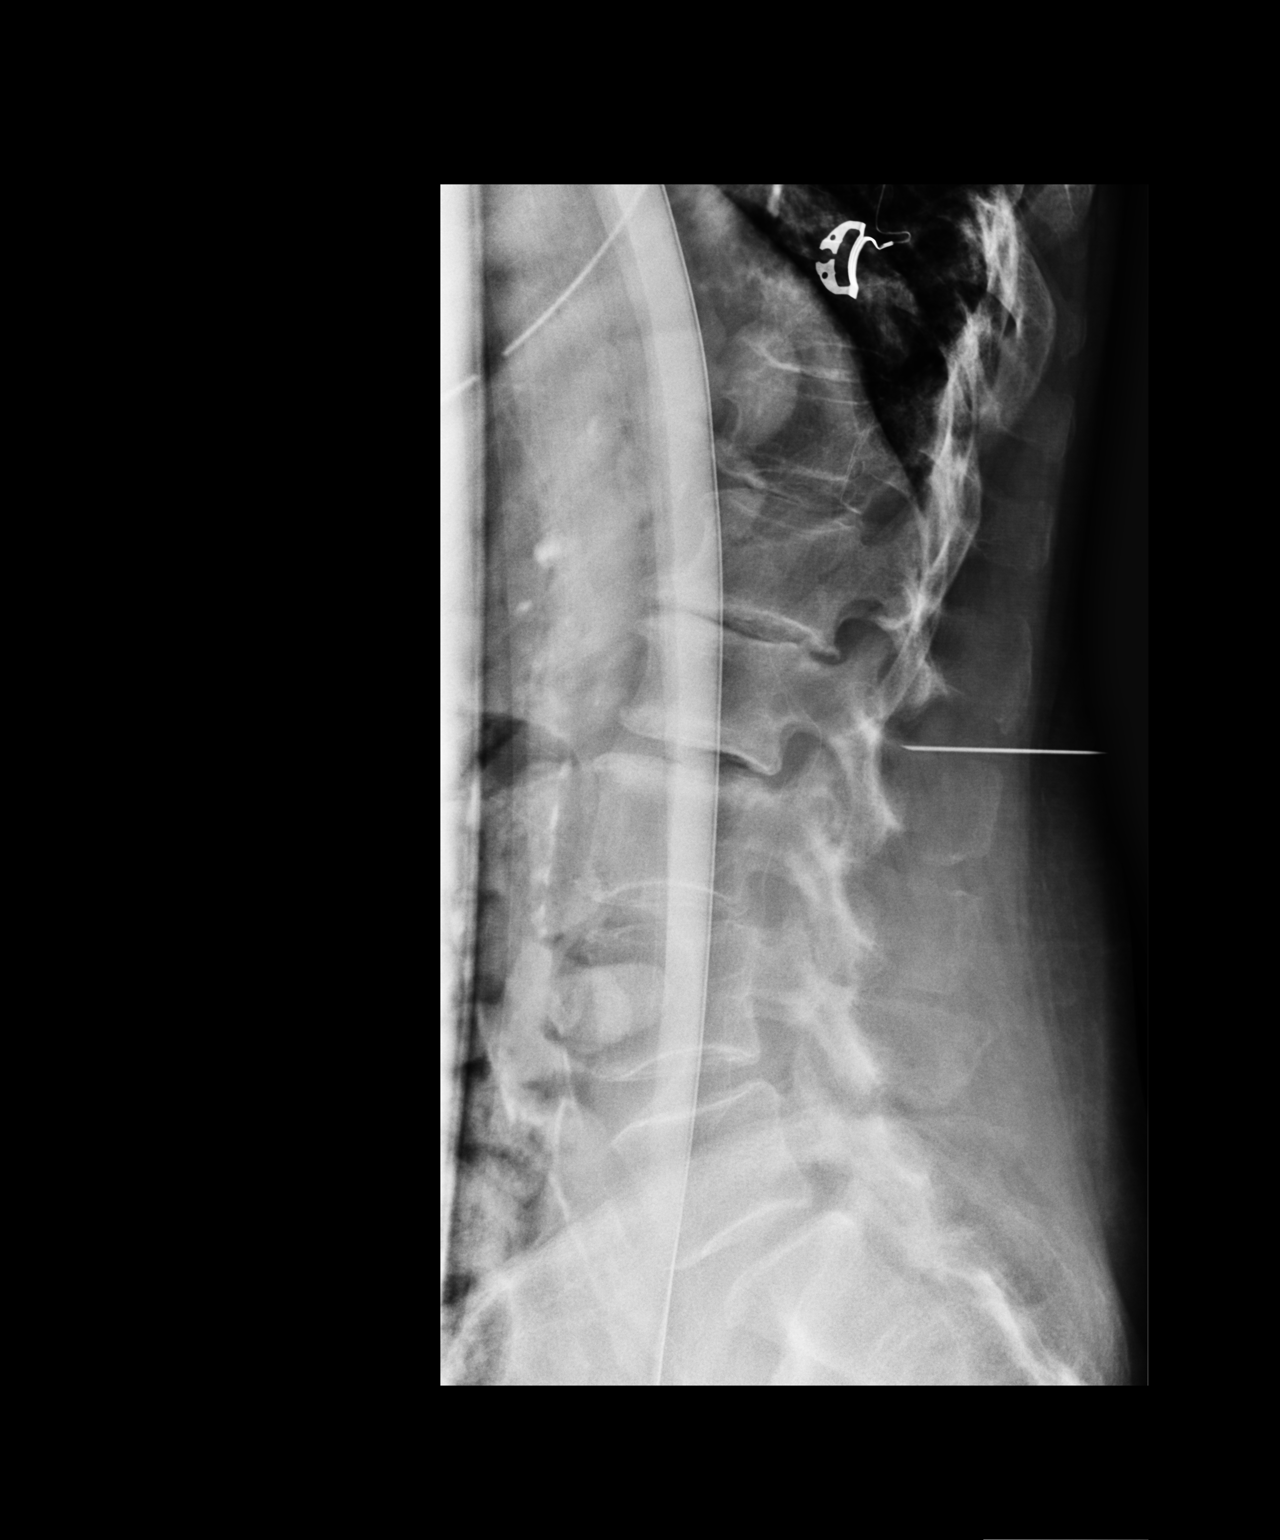

[lateral (2 of 2)]
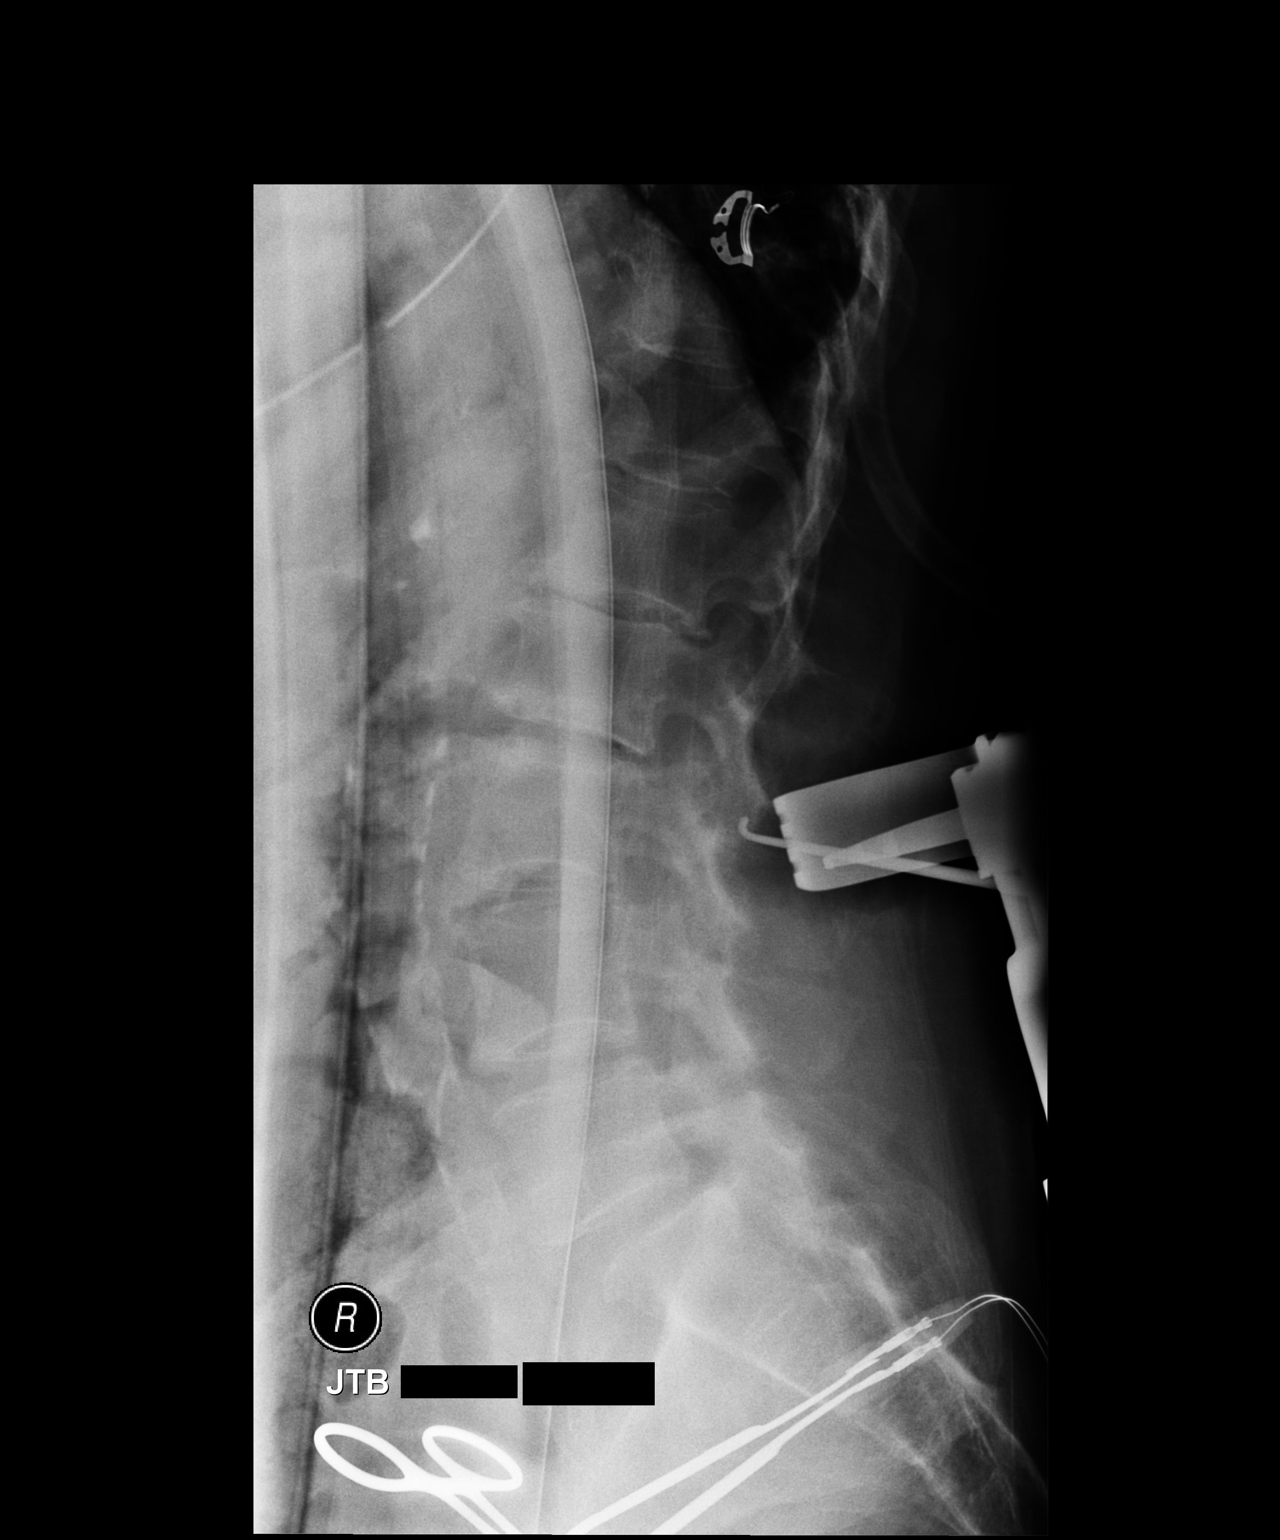

[2 of 2 positions shown; findings below may reference images not displayed]

FINDINGS: Image number 1 demonstrates a needle between the spinous process of
L1 and L2.

Image number 2 reveals a retractor posterior to the L2 pedicle at
the level the lamina.
IMPRESSION: Lumbar localization as above.

## 2017-09-25 DIAGNOSIS — R739 Hyperglycemia, unspecified: Secondary | ICD-10-CM | POA: Insufficient documentation

## 2019-02-11 ENCOUNTER — Ambulatory Visit: Payer: Medicare Other | Admitting: Gastroenterology

## 2019-02-12 ENCOUNTER — Encounter: Payer: Self-pay | Admitting: Gastroenterology

## 2019-02-12 ENCOUNTER — Encounter (INDEPENDENT_AMBULATORY_CARE_PROVIDER_SITE_OTHER): Payer: Self-pay

## 2019-02-12 ENCOUNTER — Ambulatory Visit (INDEPENDENT_AMBULATORY_CARE_PROVIDER_SITE_OTHER): Payer: Medicare Other | Admitting: Gastroenterology

## 2019-02-12 VITALS — BP 136/68 | HR 72 | Ht 59.0 in | Wt 147.4 lb

## 2019-02-12 DIAGNOSIS — K219 Gastro-esophageal reflux disease without esophagitis: Secondary | ICD-10-CM | POA: Diagnosis not present

## 2019-02-12 DIAGNOSIS — Z1212 Encounter for screening for malignant neoplasm of rectum: Secondary | ICD-10-CM

## 2019-02-12 DIAGNOSIS — Z1211 Encounter for screening for malignant neoplasm of colon: Secondary | ICD-10-CM | POA: Diagnosis not present

## 2019-02-12 NOTE — Progress Notes (Signed)
History of Present Illness: This is a 78 year old female self referred or the evaluation of GERD.  She relates she had a flare of reflux symptoms several weeks ago so she eliminated several trigger foods from her diet and began taking omeprazole in the evenings in addition to her pantoprazole in the mornings.  Her symptoms are now under very good control and she has discontinued omeprazole.  She is continuing to watch her diet more closely. Denies weight loss, abdominal pain, constipation, diarrhea, change in stool caliber, melena, hematochezia, nausea, vomiting, dysphagia, chest pain.    Allergies  Allergen Reactions  . Cefazolin Hives    04/15/17- hives and facial edema after preop dose   Outpatient Medications Prior to Visit  Medication Sig Dispense Refill  . acetaminophen (TYLENOL) 500 MG tablet Take 500 mg by mouth as needed (for pain.).     Marland Kitchen ALPRAZolam (XANAX) 0.5 MG tablet Take 0.25 mg by mouth every 8 (eight) hours as needed for anxiety.     Marland Kitchen atorvastatin (LIPITOR) 10 MG tablet Take 10 mg by mouth at bedtime.    . fluticasone (FLONASE) 50 MCG/ACT nasal spray Place 1-2 sprays into both nostrils daily as needed for allergies.    Marland Kitchen ibuprofen (ADVIL,MOTRIN) 200 MG tablet Take 400 mg by mouth as needed (for pain.).     Marland Kitchen losartan-hydrochlorothiazide (HYZAAR) 50-12.5 MG per tablet Take 1 tablet by mouth daily.    . pantoprazole (PROTONIX) 40 MG tablet Take 40 mg by mouth daily.    Marland Kitchen PARoxetine (PAXIL) 10 MG tablet Take 10 mg by mouth daily.  5  . Polyvinyl Alcohol-Povidone (REFRESH OP) Place 1-2 drops into both eyes as needed (for dry eyes/allergy eyes.).     Marland Kitchen zolpidem (AMBIEN) 10 MG tablet Take 5-10 mg by mouth at bedtime as needed for sleep.     Marland Kitchen acetaminophen-codeine (TYLENOL #3) 300-30 MG tablet Take 1 tablet by mouth every 6 (six) hours as needed. 20 tablet 0   No facility-administered medications prior to visit.    Past Medical History:  Diagnosis Date  . Anal fissure   .  Anxiety   . Arthritis   . GERD (gastroesophageal reflux disease)   . HLD (hyperlipidemia)   . Hypertension   . Insomnia   . Interstitial cystitis    Past Surgical History:  Procedure Laterality Date  . ABDOMINAL HYSTERECTOMY  1988  . APPENDECTOMY  1947  . BREAST SURGERY Right    benign cyst removed  . LUMBAR LAMINECTOMY/DECOMPRESSION MICRODISCECTOMY Left 04/19/2017   Procedure: Left Lumbar Two-Three decompressive hemilaminectomy;  Surgeon: Eustace Moore, MD;  Location: [facility];  Service: Neurosurgery;  Laterality: Left;   Social History   Socioeconomic History  . Marital status: Married    Spouse name: Not on file  . Number of children: 1  . Years of education: Not on file  . Highest education level: Not on file  Occupational History  . Occupation: retired Lawyer  . Financial resource strain: Not on file  . Food insecurity:    Worry: Not on file    Inability: Not on file  . Transportation needs:    Medical: Not on file    Non-medical: Not on file  Tobacco Use  . Smoking status: Former Smoker    Packs/day: 0.50    Years: 10.00    Pack years: 5.00    Types: Cigarettes    Last attempt to quit: 12/12/1967    Years since  quitting: 51.2  . Smokeless tobacco: Never Used  Substance and Sexual Activity  . Alcohol use: Yes    Alcohol/week: 1.0 standard drinks    Types: 1 Glasses of wine per week    Comment: ocass wine  . Drug use: No  . Sexual activity: Not on file  Lifestyle  . Physical activity:    Days per week: Not on file    Minutes per session: Not on file  . Stress: Not on file  Relationships  . Social connections:    Talks on phone: Not on file    Gets together: Not on file    Attends religious service: Not on file    Active member of club or organization: Not on file    Attends meetings of clubs or organizations: Not on file    Relationship status: Not on file  Other Topics Concern  . Not on file  Social History Narrative  . Not on file     Family History  Problem Relation Age of Onset  . Colon polyps Father   . Heart disease Mother        a-fib  . Colon cancer Paternal Grandfather        Review of Systems: Pertinent positive and negative review of systems were noted in the above HPI section. All other review of systems were otherwise negative.    Physical Exam: General: Well developed, well nourished, no acute distress Head: Normocephalic and atraumatic Eyes:  sclerae anicteric, EOMI Ears: Normal auditory acuity Mouth: No deformity or lesions Neck: Supple, no masses or thyromegaly Lungs: Clear throughout to auscultation Heart: Regular rate and rhythm; no murmurs, rubs or bruits Abdomen: Soft, non tender and non distended. No masses, hepatosplenomegaly or hernias noted. Normal Bowel sounds Rectal: Not done Musculoskeletal: Symmetrical with no gross deformities  Skin: No lesions on visible extremities Pulses:  Normal pulses noted Extremities: No clubbing, cyanosis, edema or deformities noted Neurological: Alert oriented x 4, grossly nonfocal Cervical Nodes:  No significant cervical adenopathy Inguinal Nodes: No significant inguinal adenopathy Psychological:  Alert and cooperative. Normal mood and affect   Assessment and Recommendations:  1. GERD flare.  Continue to carefully follow antireflux measures.  Continue pantoprazole 40 mg every morning.  For symptom flares add omeprazole 20 mg every afternoon for 1 - 2 weeks.  If symptoms do not remain under good control despite these measures she is advised to return for follow-up and we will consider repeat EGD.  2.  CRC screening, average risk.  Last colonoscopy performed in 2015 showed only hemorrhoids.  No plans for future screening colonoscopies due to age.  I spent 15 minutes of face-to-face time with the patient. Greater than 50% of the time was spent counseling and coordinating care.

## 2019-02-12 NOTE — Patient Instructions (Signed)
Take over the counter prilosec at bedtime.   Patient advised to avoid spicy, acidic, citrus, chocolate, mints, fruit and fruit juices.  Limit the intake of caffeine, alcohol and Soda.  Don't exercise too soon after eating.  Don't lie down within 3-4 hours of eating.  Elevate the head of your bed.  Thank you for choosing me and [facility].  Pricilla Riffle. Dagoberto Ligas., MD., Marval Regal

## 2019-05-14 ENCOUNTER — Other Ambulatory Visit: Payer: Self-pay

## 2019-05-14 ENCOUNTER — Telehealth: Payer: Self-pay | Admitting: Emergency Medicine

## 2019-05-14 ENCOUNTER — Telehealth (INDEPENDENT_AMBULATORY_CARE_PROVIDER_SITE_OTHER): Payer: Medicare Other | Admitting: Cardiology

## 2019-05-14 ENCOUNTER — Encounter: Payer: Self-pay | Admitting: Cardiology

## 2019-05-14 DIAGNOSIS — E782 Mixed hyperlipidemia: Secondary | ICD-10-CM

## 2019-05-14 DIAGNOSIS — R002 Palpitations: Secondary | ICD-10-CM

## 2019-05-14 DIAGNOSIS — R0609 Other forms of dyspnea: Secondary | ICD-10-CM | POA: Insufficient documentation

## 2019-05-14 DIAGNOSIS — R072 Precordial pain: Secondary | ICD-10-CM

## 2019-05-14 DIAGNOSIS — I10 Essential (primary) hypertension: Secondary | ICD-10-CM

## 2019-05-14 HISTORY — DX: Other forms of dyspnea: R06.09

## 2019-05-14 HISTORY — DX: Palpitations: R00.2

## 2019-05-14 HISTORY — DX: Precordial pain: R07.2

## 2019-05-14 NOTE — Progress Notes (Signed)
Virtual Visit via Video Note   This visit type was conducted due to national recommendations for restrictions regarding the COVID-19 Pandemic (e.g. social distancing) in an effort to limit this patient's exposure and mitigate transmission in our community.  Due to her co-morbid illnesses, this patient is at least at moderate risk for complications without adequate follow up.  This format is felt to be most appropriate for this patient at this time.  All issues noted in this document were discussed and addressed.  A limited physical exam was performed with this format.  Please refer to the patient's chart for her consent to telehealth for [facility].  Evaluation Performed:  Follow-up visit  This visit type was conducted due to national recommendations for restrictions regarding the COVID-19 Pandemic (e.g. social distancing).  This format is felt to be most appropriate for this patient at this time.  All issues noted in this document were discussed and addressed.  No physical exam was performed (except for noted visual exam findings with Video Visits).  Please refer to the patient's chart (MyChart message for video visits and phone note for telephone visits) for the patient's consent to telehealth for [facility].  Date:  05/14/2019  ID: Jenna Poole, DOB November 06, 1941, MRN [id number]   Patient Location: [address] [place] [address]   Provider location:   [place] Office  PCP:  Jenna Poole., MD  Cardiologist:  Jenne Campus, MD     Chief Complaint: I have some shortness of breath and chest pain  History of Present Illness:    Jenna Poole is a 78 y.o. female  who presents via audio/video conferencing for a telehealth visit today.  With history of tachycardia, that being investigated many years ago by Dr.Dhatt, also hypertension, dyslipidemia, some anxiety.  For last few weeks she experienced some shortness of breath while doing activities of daily living  that shortness of breath is associated also with some uneasy sensation in the chest it is only minimal sensation but sufficient enough for her to bring this to our attention.  The example of things that she does that will bring it up will be making back in the morning.  She is not exercising on the regular basis.  Also described to have situation when her heart will speed up.  It can happen at any time that last for about 5 minutes she will notice gradual increase in heart rate and gradual decrease in her heart rate.  There is no shortness of breath there is no chest pain associated with this sensation. She never had substantial heart problem she was checked few years ago and work-up was negative. Does have hypertension dyslipidemia   The patient does not have symptoms concerning for COVID-19 infection (fever, chills, cough, or new SHORTNESS OF BREATH).    Prior CV studies:   The following studies were reviewed today:  I reviewed her fasting lipid profile which was done just few days ago LDL is elevated 125.  HDL is 49     Past Medical History:  Diagnosis Date  . Anal fissure   . Anxiety   . Arthritis   . GERD (gastroesophageal reflux disease)   . HLD (hyperlipidemia)   . Hypertension   . Insomnia   . Interstitial cystitis     Past Surgical History:  Procedure Laterality Date  . ABDOMINAL HYSTERECTOMY  1988  . APPENDECTOMY  1947  . BREAST SURGERY Right    benign cyst removed  .  LUMBAR LAMINECTOMY/DECOMPRESSION MICRODISCECTOMY Left 04/19/2017   Procedure: Left Lumbar Two-Three decompressive hemilaminectomy;  Surgeon: Eustace Moore, MD;  Location: [facility];  Service: Neurosurgery;  Laterality: Left;     Current Meds  Medication Sig  . acetaminophen (TYLENOL) 500 MG tablet Take 500 mg by mouth as needed (for pain.).   Marland Kitchen ALPRAZolam (XANAX) 0.5 MG tablet Take 0.25 mg by mouth every 8 (eight) hours as needed for anxiety.   Marland Kitchen atorvastatin (LIPITOR) 10 MG tablet Take 10 mg by mouth at  bedtime.  . fluticasone (FLONASE) 50 MCG/ACT nasal spray Place 1-2 sprays into both nostrils daily as needed for allergies.  Marland Kitchen ibuprofen (ADVIL,MOTRIN) 200 MG tablet Take 400 mg by mouth as needed (for pain.).   Marland Kitchen losartan-hydrochlorothiazide (HYZAAR) 50-12.5 MG per tablet Take 1 tablet by mouth daily.  . pantoprazole (PROTONIX) 40 MG tablet Take 40 mg by mouth daily.  Marland Kitchen PARoxetine (PAXIL) 10 MG tablet Take 10 mg by mouth daily.  . Polyvinyl Alcohol-Povidone (REFRESH OP) Place 1-2 drops into both eyes as needed (for dry eyes/allergy eyes.).   Marland Kitchen zolpidem (AMBIEN) 10 MG tablet Take 5-10 mg by mouth at bedtime as needed for sleep.       Family History: The patient's family history includes Colon cancer in her paternal grandfather; Colon polyps in her father; Heart disease in her mother.   ROS:   Please see the history of present illness.     All other systems reviewed and are negative.   Labs/Other Tests and Data Reviewed:     Recent Labs: No results found for requested labs within last 8760 hours.  Recent Lipid Panel No results found for: CHOL, TRIG, HDL, CHOLHDL, VLDL, LDLCALC, LDLDIRECT    Exam:    Vital Signs:  There were no vitals taken for this visit.    Wt Readings from Last 3 Encounters:  02/12/19 147 lb 6 oz (66.8 kg)  04/10/17 140 lb 4 oz (63.6 kg)  05/04/15 144 lb 8 oz (65.5 kg)     Well nourished, well developed in no acute distress. Alert awake oriented x3 quite cheerful over the phone not in any distress denies have any chest pain tightness squeezing pressure burning chest  Diagnosis for this visit:   1. Mixed hyperlipidemia   2. Palpitations   3. Dyspnea on exertion   4. Precordial chest pain   5. Essential hypertension      ASSESSMENT & PLAN:    1.  Chest pain precordial only very mild but obviously worrisome.  I will ask you to have echocardiogram, she also will be scheduled to have a stress test will do Lexiscan.  Ask you to start taking baby  aspirin every single day and asked her to not exert yourself herself to the point that she is getting pain. 2.  Palpitations in the future we will put monitor on her 20 to get if she got any significant arrhythmia  3.  Dyspnea on exertion echocardiogram will be scheduled as well as stress test. 4.  Essential hypertension appears to be well controlled   COVID-19 Education: The signs and symptoms of COVID-19 were discussed with the patient and how to seek care for testing (follow up with PCP or arrange E-visit).  The importance of social distancing was discussed today.  Patient Risk:   After full review of this patients clinical status, I feel that they are at least moderate risk at this time.  Time:   Today, I have spent 21 minutes  with the patient with telehealth technology discussing pt health issues.  I spent 15 minutes reviewing her chart before the visit.  Visit was finished at 11:18 AM.    Medication Adjustments/Labs and Tests Ordered: Current medicines are reviewed at length with the patient today.  Concerns regarding medicines are outlined above.  No orders of the defined types were placed in this encounter.  Medication changes: No orders of the defined types were placed in this encounter.    Disposition: Follow-up 6 weeks  Signed, Park Liter, MD, [facility] 05/14/2019 11:19 AM    [place]

## 2019-05-14 NOTE — Telephone Encounter (Signed)
Called patient to inform her of appt dates and times of cardiac nuclear scan and echocardiogram.

## 2019-05-14 NOTE — Patient Instructions (Signed)
Medication Instructions:  Your physician recommends that you continue on your current medications as directed. Please refer to the Current Medication list given to you today.  If you need a refill on your cardiac medications before your next appointment, please call your pharmacy.   Lab work: None.  If you have labs (blood work) drawn today and your tests are completely normal, you will receive your results only by:  [place] (if you have MyChart) OR  A paper copy in the mail If you have any lab test that is abnormal or we need to change your treatment, we will call you to review the results.  Testing/Procedures: Your physician has requested that you have a lexiscan myoview. For further information please visit [email or web address]. Please follow instruction sheet, as given.    [facility] [address] [place], [place] [address] Phone:  [phone]  May 14, 2019    Jenna Poole DOB: January 05, 1941 MRN: [id number] [address] [place] [address]   Dear Jenna Poole,  You will be scheduled for a cardiac nuclear scan.  Please arrive 15 minutes prior to your appointment time for registration and insurance purposes.  The test will take approximately 3 to 4 hours to complete; you may bring reading material.  If someone comes with you to your appointment, they will need to remain in the main lobby due to limited space in the testing area. **If you are pregnant or breastfeeding, please notify the nuclear lab prior to your appointment**  How to prepare for your Myocardial Perfusion Test:  Do not eat or drink 3 hours prior to your test, except you may have water.  Do not consume products containing caffeine (regular or decaffeinated) 12 hours prior to your test. (ex: coffee, chocolate, sodas, tea).  Do bring a list of your current medications with you.  If not listed below, you may take your medications as normal.   HOLD  losartan-hydrochlorothiazide the day of the test.    Do wear comfortable clothes (no dresses or overalls) and walking shoes, tennis shoes preferred (No heels or open toe shoes are allowed).  Do NOT wear cologne, perfume, aftershave, or lotions (deodorant is allowed).  If these instructions are not followed, your test will have to be rescheduled.  Please report to [address] for your test.  If you have questions or concerns about your appointment, you can call the [facility] at [phone].  If you cannot keep your appointment, please provide 24 hours notification to the Nuclear Lab, to avoid a possible $50 charge to your account.  Your physician has requested that you have an echocardiogram. Echocardiography is a painless test that uses sound waves to create images of your heart. It provides your doctor with information about the size and shape of your heart and how well your hearts chambers and valves are working. This procedure takes approximately one hour. There are no restrictions for this procedure.    Follow-Up: At [facility], you and your health needs are our priority.  As part of our continuing mission to provide you with exceptional heart care, we have created designated Provider Care Teams.  These Care Teams include your primary Cardiologist (physician) and Advanced Practice Providers (APPs -  Physician Assistants and Nurse Practitioners) who all work together to provide you with the care you need, when you need it. You will need a follow up appointment in 6 weeks.  Please call our office 2  months in advance to schedule this appointment.  You may see No primary care provider on file. or another member of our [employer] Provider Team in [place]: Shirlee More, MD  Jyl Heinz, MD  Any Other Special Instructions Will Be Listed Below (If Applicable).   Cardiac Nuclear Scan A cardiac nuclear scan is a test that measures blood  flow to the heart when a person is resting and when he or she is exercising. The test looks for problems such as:  Not enough blood reaching a portion of the heart.  The heart muscle not working normally. You may need this test if:  You have heart disease.  You have had abnormal lab results.  You have had heart surgery or a balloon procedure to open up blocked arteries (angioplasty).  You have chest pain.  You have shortness of breath. In this test, a radioactive dye (tracer) is injected into your bloodstream. After the tracer has traveled to your heart, an imaging device is used to measure how much of the tracer is absorbed by or distributed to various areas of your heart. This procedure is usually done at a hospital and takes 2-4 hours. Tell a health care provider about:  Any allergies you have.  All medicines you are taking, including vitamins, herbs, eye drops, creams, and over-the-counter medicines.  Any problems you or family members have had with anesthetic medicines.  Any blood disorders you have.  Any surgeries you have had.  Any medical conditions you have.  Whether you are pregnant or may be pregnant. What are the risks? Generally, this is a safe procedure. However, problems may occur, including:  Serious chest pain and heart attack. This is only a risk if the stress portion of the test is done.  Rapid heartbeat.  Sensation of warmth in your chest. This usually passes quickly.  Allergic reaction to the tracer. What happens before the procedure?  Ask your health care provider about changing or stopping your regular medicines. This is especially important if you are taking diabetes medicines or blood thinners.  Follow instructions from your health care provider about eating or drinking restrictions.  Remove your jewelry on the day of the procedure. What happens during the procedure?  An IV will be inserted into one of your veins.  Your health care  provider will inject a small amount of radioactive tracer through the IV.  You will wait for 20-40 minutes while the tracer travels through your bloodstream.  Your heart activity will be monitored with an electrocardiogram (ECG).  You will lie down on an exam table.  Images of your heart will be taken for about 15-20 minutes.  You may also have a stress test. For this test, one of the following may be done: ? You will exercise on a treadmill or stationary bike. While you exercise, your heart's activity will be monitored with an ECG, and your blood pressure will be checked. ? You will be given medicines that will increase blood flow to parts of your heart. This is done if you are unable to exercise.  When blood flow to your heart has peaked, a tracer will again be injected through the IV.  After 20-40 minutes, you will get back on the exam table and have more images taken of your heart.  Depending on the type of tracer used, scans may need to be repeated 3-4 hours later.  Your IV line will be removed when the procedure is over. The procedure may vary among  health care providers and hospitals. What happens after the procedure?  Unless your health care provider tells you otherwise, you may return to your normal schedule, including diet, activities, and medicines.  Unless your health care provider tells you otherwise, you may increase your fluid intake. This will help to flush the contrast dye from your body. Drink enough fluid to keep your urine pale yellow.  Ask your health care provider, or the department that is doing the test: ? When will my results be ready? ? How will I get my results? Summary  A cardiac nuclear scan measures the blood flow to the heart when a person is resting and when he or she is exercising.  Tell your health care provider if you are pregnant.  Before the procedure, ask your health care provider about changing or stopping your regular medicines. This is  especially important if you are taking diabetes medicines or blood thinners.  After the procedure, unless your health care provider tells you otherwise, increase your fluid intake. This will help flush the contrast dye from your body.  After the procedure, unless your health care provider tells you otherwise, you may return to your normal schedule, including diet, activities, and medicines. This information is not intended to replace advice given to you by your health care provider. Make sure you discuss any questions you have with your health care provider. Document Released: 12/22/2004 Document Revised: 05/13/2018 Document Reviewed: 05/13/2018 Elsevier Interactive Patient Education  2019 [employer].    Echocardiogram An echocardiogram is a procedure that uses painless sound waves (ultrasound) to produce an image of the heart. Images from an echocardiogram can provide important information about:  Signs of coronary artery disease (CAD).  Aneurysm detection. An aneurysm is a weak or damaged part of an artery wall that bulges out from the normal force of blood pumping through the body.  Heart size and shape. Changes in the size or shape of the heart can be associated with certain conditions, including heart failure, aneurysm, and CAD.  Heart muscle function.  Heart valve function.  Signs of a past heart attack.  Fluid buildup around the heart.  Thickening of the heart muscle.  A tumor or infectious growth around the heart valves. Tell a health care provider about:  Any allergies you have.  All medicines you are taking, including vitamins, herbs, eye drops, creams, and over-the-counter medicines.  Any blood disorders you have.  Any surgeries you have had.  Any medical conditions you have.  Whether you are pregnant or may be pregnant. What are the risks? Generally, this is a safe procedure. However, problems may occur, including:  Allergic reaction to dye (contrast)  that may be used during the procedure. What happens before the procedure? No specific preparation is needed. You may eat and drink normally. What happens during the procedure?   An IV tube may be inserted into one of your veins.  You may receive contrast through this tube. A contrast is an injection that improves the quality of the pictures from your heart.  A gel will be applied to your chest.  A wand-like tool (transducer) will be moved over your chest. The gel will help to transmit the sound waves from the transducer.  The sound waves will harmlessly bounce off of your heart to allow the heart images to be captured in real-time motion. The images will be recorded on a computer. The procedure may vary among health care providers and hospitals. What happens after the procedure?  You may return to your normal, everyday life, including diet, activities, and medicines, unless your health care provider tells you not to do that. Summary  An echocardiogram is a procedure that uses painless sound waves (ultrasound) to produce an image of the heart.  Images from an echocardiogram can provide important information about the size and shape of your heart, heart muscle function, heart valve function, and fluid buildup around your heart.  You do not need to do anything to prepare before this procedure. You may eat and drink normally.  After the echocardiogram is completed, you may return to your normal, everyday life, unless your health care provider tells you not to do that. This information is not intended to replace advice given to you by your health care provider. Make sure you discuss any questions you have with your health care provider. Document Released: 11/24/2000 Document Revised: 12/30/2016 Document Reviewed: 12/30/2016 Elsevier Interactive Patient Education  2019 [employer].

## 2019-05-14 NOTE — Addendum Note (Signed)
Addended by: Ashok Norris on: 05/14/2019 11:53 AM   Modules accepted: Orders

## 2019-06-06 ENCOUNTER — Telehealth (HOSPITAL_COMMUNITY): Payer: Self-pay | Admitting: *Deleted

## 2019-06-06 NOTE — Telephone Encounter (Signed)
Patient given detailed instructions per Myocardial Perfusion Study Information Sheet for the test on 06/10/19. Patient notified to arrive 15 minutes early and that it is imperative to arrive on time for appointment to keep from having the test rescheduled.  If you need to cancel or reschedule your appointment, please call the office within 24 hours of your appointment. . Patient verbalized understanding. Kirstie Peri

## 2019-06-10 ENCOUNTER — Other Ambulatory Visit: Payer: Self-pay

## 2019-06-10 ENCOUNTER — Ambulatory Visit (INDEPENDENT_AMBULATORY_CARE_PROVIDER_SITE_OTHER): Payer: Medicare Other

## 2019-06-10 DIAGNOSIS — R002 Palpitations: Secondary | ICD-10-CM

## 2019-06-10 DIAGNOSIS — R072 Precordial pain: Secondary | ICD-10-CM

## 2019-06-10 DIAGNOSIS — I10 Essential (primary) hypertension: Secondary | ICD-10-CM

## 2019-06-10 DIAGNOSIS — R0609 Other forms of dyspnea: Secondary | ICD-10-CM | POA: Diagnosis not present

## 2019-06-10 LAB — MYOCARDIAL PERFUSION IMAGING
LV dias vol: 45 mL (ref 46–106)
LV sys vol: 11 mL
Peak HR: 90 {beats}/min
Rest HR: 64 {beats}/min
SDS: 0
SRS: 3
SSS: 3
TID: 1.11

## 2019-06-10 MED ORDER — TECHNETIUM TC 99M TETROFOSMIN IV KIT
10.2000 | PACK | Freq: Once | INTRAVENOUS | Status: AC | PRN
  Administered 2019-06-10: 10.2 via INTRAVENOUS

## 2019-06-10 MED ORDER — REGADENOSON 0.4 MG/5ML IV SOLN
0.4000 mg | Freq: Once | INTRAVENOUS | Status: AC
  Administered 2019-06-10: 0.4 mg via INTRAVENOUS

## 2019-06-10 MED ORDER — TECHNETIUM TC 99M TETROFOSMIN IV KIT
32.8000 | PACK | Freq: Once | INTRAVENOUS | Status: AC | PRN
  Administered 2019-06-10: 32.8 via INTRAVENOUS

## 2019-06-12 ENCOUNTER — Other Ambulatory Visit: Payer: Self-pay

## 2019-06-12 ENCOUNTER — Ambulatory Visit (INDEPENDENT_AMBULATORY_CARE_PROVIDER_SITE_OTHER): Payer: Medicare Other

## 2019-06-12 DIAGNOSIS — R0609 Other forms of dyspnea: Secondary | ICD-10-CM

## 2019-06-12 DIAGNOSIS — I10 Essential (primary) hypertension: Secondary | ICD-10-CM

## 2019-06-12 DIAGNOSIS — R002 Palpitations: Secondary | ICD-10-CM

## 2019-06-12 DIAGNOSIS — R072 Precordial pain: Secondary | ICD-10-CM | POA: Diagnosis not present

## 2019-06-12 NOTE — Progress Notes (Signed)
2D Echocardiogram performed   06/12/19  Cardell Peach RDCS, RVT

## 2019-06-20 ENCOUNTER — Encounter: Payer: Self-pay | Admitting: Cardiology

## 2019-06-20 ENCOUNTER — Telehealth (INDEPENDENT_AMBULATORY_CARE_PROVIDER_SITE_OTHER): Payer: Medicare Other | Admitting: Cardiology

## 2019-06-20 ENCOUNTER — Other Ambulatory Visit: Payer: Self-pay

## 2019-06-20 VITALS — Wt 142.0 lb

## 2019-06-20 DIAGNOSIS — R072 Precordial pain: Secondary | ICD-10-CM

## 2019-06-20 DIAGNOSIS — N183 Chronic kidney disease, stage 3 unspecified: Secondary | ICD-10-CM

## 2019-06-20 DIAGNOSIS — R002 Palpitations: Secondary | ICD-10-CM

## 2019-06-20 DIAGNOSIS — I10 Essential (primary) hypertension: Secondary | ICD-10-CM

## 2019-06-20 DIAGNOSIS — R0609 Other forms of dyspnea: Secondary | ICD-10-CM

## 2019-06-20 NOTE — Patient Instructions (Signed)
Medication Instructions: Your physician recommends that you continue on your current medications as directed. Please refer to the Current Medication list given to you today.  If you need a refill on your cardiac medications before your next appointment, please call your pharmacy.   Lab work: None If you have labs (blood work) drawn today and your tests are completely normal, you will receive your results only by: Marland Kitchen MyChart Message (if you have MyChart) OR . A paper copy in the mail If you have any lab test that is abnormal or we need to change your treatment, we will call you to review the results.  Testing/Procedures: Your physician has recommended that you wear an event monitor. Event monitors are medical devices that record the heart's electrical activity. Doctors most often [place] these monitors to diagnose arrhythmias. Arrhythmias are problems with the speed or rhythm of the heartbeat. The monitor is a small, portable device. You can wear one while you do your normal daily activities. This is usually used to diagnose what is causing palpitations/syncope (passing out).   Follow-Up: At [facility], you and your health needs are our priority.  As part of our continuing mission to provide you with exceptional heart care, we have created designated Provider Care Teams.  These Care Teams include your primary Cardiologist (physician) and Advanced Practice Providers (APPs -  Physician Assistants and Nurse Practitioners) who all work together to provide you with the care you need, when you need it. . You will need a follow up appointment in 2 months.   Any Other Special Instructions Will Be Listed Below (If Applicable). None

## 2019-06-20 NOTE — Progress Notes (Signed)
Virtual Visit via Video Note   This visit type was conducted due to national recommendations for restrictions regarding the COVID-19 Pandemic (e.g. social distancing) in an effort to limit this patient's exposure and mitigate transmission in our community.  Due to her co-morbid illnesses, this patient is at least at moderate risk for complications without adequate follow up.  This format is felt to be most appropriate for this patient at this time.  All issues noted in this document were discussed and addressed.  A limited physical exam was performed with this format.  Please refer to the patient's chart for her consent to telehealth for [facility].  Evaluation Performed:  Follow-up visit  This visit type was conducted due to national recommendations for restrictions regarding the COVID-19 Pandemic (e.g. social distancing).  This format is felt to be most appropriate for this patient at this time.  All issues noted in this document were discussed and addressed.  No physical exam was performed (except for noted visual exam findings with Video Visits).  Please refer to the patient's chart (MyChart message for video visits and phone note for telephone visits) for the patient's consent to telehealth for [facility].  Date:  06/20/2019  ID: Jenna Poole, DOB 23-Dec-1940, MRN [id number]   Patient Location: [place] [address]   Provider location:   [place] Office  PCP:  Raina Mina., MD  Cardiologist:  Dr. Agustin Cree     Chief Complaint: Doing well but very rare palpitations  History of Present Illness:    Jenna Poole is a 78 y.o. female  who presents via audio/video conferencing for a telehealth visit today.  Past medical history tachycardia, hypertension, dyslipidemia, some anxiety, shortness of breath.  Last seen by me 05/14/2019 with a chief complaint of chest pain and shortness of breath.  She was asked to undergo echocardiogram and stress test.   Overall doing well busy taking care of her household.  Her husband recently fell down and broke her hip and of course she got much more work to the right now.   The patient does not have symptoms concerning for COVID-19 infection (fever, chills, cough, or new SHORTNESS OF BREATH).    Prior CV studies:   The following studies were reviewed today:  Echocardiogram showed preserved left ventricular ejection fraction, stress test showed no evidence of ischemia     Past Medical History:  Diagnosis Date  . Anal fissure   . Anxiety   . Arthritis   . GERD (gastroesophageal reflux disease)   . HLD (hyperlipidemia)   . Hypertension   . Insomnia   . Interstitial cystitis     Past Surgical History:  Procedure Laterality Date  . ABDOMINAL HYSTERECTOMY  1988  . APPENDECTOMY  1947  . BREAST SURGERY Right    benign cyst removed  . LUMBAR LAMINECTOMY/DECOMPRESSION MICRODISCECTOMY Left 04/19/2017   Procedure: Left Lumbar Two-Three decompressive hemilaminectomy;  Surgeon: Eustace Moore, MD;  Location: [place];  Service: Neurosurgery;  Laterality: Left;     No outpatient medications have been marked as taking for the 06/20/19 encounter (Appointment) with Park Liter, MD.      Family History: The patient's family history includes Colon cancer in her paternal grandfather; Colon polyps in her father; Heart disease in her mother.   ROS:   Please see the history of present illness.     All other systems reviewed and are negative.   Labs/Other Tests and Data  Reviewed:     Recent Labs: No results found for requested labs within last 8760 hours.  Recent Lipid Panel No results found for: CHOL, TRIG, HDL, CHOLHDL, VLDL, LDLCALC, LDLDIRECT    Exam:    Vital Signs:  There were no vitals taken for this visit.    Wt Readings from Last 3 Encounters:  06/10/19 147 lb (66.7 kg)  02/12/19 147 lb 6 oz (66.8 kg)  04/10/17 140 lb 4 oz (63.6 kg)     Well nourished, well developed in  no acute distress. Alert awake oriented x3 talking to me over the video link we had some difficulty with audio therefore we did hydrate office visit meaning see her on the video link and I talked to her over station online.  She is asymptomatic at the time of my interview and stable.  Diagnosis for this visit:   No diagnosis found.   ASSESSMENT & PLAN:    1.  Palpitations.  Will put monitor on her for a week to see if there is any significant arrhythmia. 2.  Atypical chest pain.  Stress test negative. 3.  Dyspnea on exertion.  Echocardiogram showed normal left ventricular ejection fraction.  Encouraged her to be a little more active.  COVID-19 Education: The signs and symptoms of COVID-19 were discussed with the patient and how to seek care for testing (follow up with PCP or arrange E-visit).  The importance of social distancing was discussed today.  Patient Risk:   After full review of this patients clinical status, I feel that they are at least moderate risk at this time.  Time:   Today, I have spent 18 minutes with the patient with telehealth technology discussing pt health issues.  I spent 5 minutes reviewing her chart before the visit.  Visit was finished at 10:13 AM.    Medication Adjustments/Labs and Tests Ordered: Current medicines are reviewed at length with the patient today.  Concerns regarding medicines are outlined above.  No orders of the defined types were placed in this encounter.  Medication changes: No orders of the defined types were placed in this encounter.    Disposition: Follow-up in 2 months  Signed, Park Liter, MD, [facility] 06/20/2019 8:23 AM    [place]

## 2019-07-03 ENCOUNTER — Other Ambulatory Visit (INDEPENDENT_AMBULATORY_CARE_PROVIDER_SITE_OTHER): Payer: Medicare Other

## 2019-07-03 DIAGNOSIS — R002 Palpitations: Secondary | ICD-10-CM

## 2019-07-03 DIAGNOSIS — R0609 Other forms of dyspnea: Secondary | ICD-10-CM

## 2019-07-18 NOTE — Addendum Note (Signed)
Addended by: Aleatha Borer on: 07/18/2019 10:08 AM   Modules accepted: Orders

## 2019-07-22 ENCOUNTER — Telehealth: Payer: Self-pay | Admitting: Emergency Medicine

## 2019-07-22 MED ORDER — METOPROLOL TARTRATE 25 MG PO TABS: 25.0000 mg | ORAL_TABLET | Freq: Two times a day (BID) | ORAL | 1 refills | Status: DC

## 2019-07-22 NOTE — Telephone Encounter (Signed)
Patient informed of monitor results and advised to start metoprolol tartrate 25 mg twice daily. Patient verbally understands. No further questions.

## 2019-08-19 ENCOUNTER — Ambulatory Visit (INDEPENDENT_AMBULATORY_CARE_PROVIDER_SITE_OTHER): Payer: Medicare Other | Admitting: Cardiology

## 2019-08-19 ENCOUNTER — Other Ambulatory Visit: Payer: Self-pay

## 2019-08-19 ENCOUNTER — Encounter: Payer: Self-pay | Admitting: Cardiology

## 2019-08-19 VITALS — BP 128/72 | HR 46 | Resp 16 | Wt 145.8 lb

## 2019-08-19 DIAGNOSIS — R0609 Other forms of dyspnea: Secondary | ICD-10-CM

## 2019-08-19 DIAGNOSIS — I10 Essential (primary) hypertension: Secondary | ICD-10-CM

## 2019-08-19 DIAGNOSIS — R002 Palpitations: Secondary | ICD-10-CM | POA: Diagnosis not present

## 2019-08-19 DIAGNOSIS — I471 Supraventricular tachycardia, unspecified: Secondary | ICD-10-CM | POA: Insufficient documentation

## 2019-08-19 DIAGNOSIS — R072 Precordial pain: Secondary | ICD-10-CM

## 2019-08-19 MED ORDER — METOPROLOL SUCCINATE ER 25 MG PO TB24: 25.0000 mg | ORAL_TABLET | Freq: Every day | ORAL | 1 refills | Status: DC

## 2019-08-19 NOTE — Progress Notes (Signed)
Cardiology Office Note:    Date:  08/19/2019   ID:  Jenna Poole, DOB 23-May-1941, MRN [id number]  PCP:  Raina Mina., MD  Cardiologist:  Jenne Campus, MD    Referring MD: Raina Mina., MD   Chief Complaint  Patient presents with  . Follow-up  Doing better  History of Present Illness:    Jenna Poole is a 78 y.o. female who recently was evaluated for atypical cardiac symptoms that included stress test and echocardiogram both were normal.  She started also complaining of palpitations.  Wear monitor which showed runs of nonsustained supraventricular tachycardia.  I put her on metoprolol tartrate 50 mg daily however she became dizzy and lowered the dose to 25 a day and she feels better.  Denies having any palpitations now overall doing well.  Past Medical History:  Diagnosis Date  . Anal fissure   . Anxiety   . Arthritis   . GERD (gastroesophageal reflux disease)   . HLD (hyperlipidemia)   . Hypertension   . Insomnia   . Interstitial cystitis     Past Surgical History:  Procedure Laterality Date  . ABDOMINAL HYSTERECTOMY  1988  . APPENDECTOMY  1947  . BREAST SURGERY Right    benign cyst removed  . LUMBAR LAMINECTOMY/DECOMPRESSION MICRODISCECTOMY Left 04/19/2017   Procedure: Left Lumbar Two-Three decompressive hemilaminectomy;  Surgeon: Eustace Moore, MD;  Location: [place];  Service: Neurosurgery;  Laterality: Left;    Current Medications: Current Meds  Medication Sig  . acetaminophen (TYLENOL) 500 MG tablet Take 500 mg by mouth as needed (for pain.).   Marland Kitchen ALPRAZolam (XANAX) 0.5 MG tablet Take 0.25 mg by mouth every 8 (eight) hours as needed for anxiety.   Marland Kitchen atorvastatin (LIPITOR) 10 MG tablet Take 10 mg by mouth at bedtime.  . fluticasone (FLONASE) 50 MCG/ACT nasal spray Place 1-2 sprays into both nostrils daily as needed for allergies.  Marland Kitchen ibuprofen (ADVIL,MOTRIN) 200 MG tablet Take 400 mg by mouth as needed (for pain.).   Marland Kitchen losartan-hydrochlorothiazide  (HYZAAR) 50-12.5 MG per tablet Take 1 tablet by mouth daily.  . metoprolol tartrate (LOPRESSOR) 25 MG tablet Take 1 tablet (25 mg total) by mouth 2 (two) times daily. (Patient taking differently: Take 12.5 mg by mouth 2 (two) times daily. )  . pantoprazole (PROTONIX) 40 MG tablet Take 40 mg by mouth daily.  Marland Kitchen PARoxetine (PAXIL) 10 MG tablet Take 10 mg by mouth daily.  . Polyvinyl Alcohol-Povidone (REFRESH OP) Place 1-2 drops into both eyes as needed (for dry eyes/allergy eyes.).   Marland Kitchen zolpidem (AMBIEN) 10 MG tablet Take 5-10 mg by mouth at bedtime as needed for sleep.      Allergies:   Cefazolin   Social History   Socioeconomic History  . Marital status: Married    Spouse name: Not on file  . Number of children: 1  . Years of education: Not on file  . Highest education level: Not on file  Occupational History  . Occupation: retired Lawyer  . Financial resource strain: Not on file  . Food insecurity    Worry: Not on file    Inability: Not on file  . Transportation needs    Medical: Not on file    Non-medical: Not on file  Tobacco Use  . Smoking status: Former Smoker    Packs/day: 0.50    Years: 10.00    Pack years: 5.00    Types: Cigarettes    Quit date:  12/12/1967    Years since quitting: 51.7  . Smokeless tobacco: Never Used  Substance and Sexual Activity  . Alcohol use: Yes    Alcohol/week: 1.0 standard drinks    Types: 1 Glasses of wine per week    Comment: ocass wine  . Drug use: No  . Sexual activity: Not on file  Lifestyle  . Physical activity    Days per week: Not on file    Minutes per session: Not on file  . Stress: Not on file  Relationships  . Social Herbalist on phone: Not on file    Gets together: Not on file    Attends religious service: Not on file    Active member of club or organization: Not on file    Attends meetings of clubs or organizations: Not on file    Relationship status: Not on file  Other Topics Concern  .  Not on file  Social History Narrative  . Not on file     Family History: The patient's family history includes Colon cancer in her paternal grandfather; Colon polyps in her father; Heart disease in her mother. ROS:   Please see the history of present illness.    All 14 point review of systems negative except as described per history of present illness  EKGs/Labs/Other Studies Reviewed:      Recent Labs: No results found for requested labs within last 8760 hours.  Recent Lipid Panel No results found for: CHOL, TRIG, HDL, CHOLHDL, VLDL, LDLCALC, LDLDIRECT  Physical Exam:    VS:  BP 128/72 (BP Location: Left Arm, Patient Position: Sitting)   Pulse (!) 46   Resp 16   Wt 145 lb 12.8 oz (66.1 kg)   SpO2 98%   BMI 29.45 kg/m     Wt Readings from Last 3 Encounters:  08/19/19 145 lb 12.8 oz (66.1 kg)  06/20/19 142 lb (64.4 kg)  06/10/19 147 lb (66.7 kg)     GEN:  Well nourished, well developed in no acute distress HEENT: Normal NECK: No JVD; No carotid bruits LYMPHATICS: No lymphadenopathy CARDIAC: RRR, no murmurs, no rubs, no gallops RESPIRATORY:  Clear to auscultation without rales, wheezing or rhonchi  ABDOMEN: Soft, non-tender, non-distended MUSCULOSKELETAL:  No edema; No deformity  SKIN: Warm and dry LOWER EXTREMITIES: no swelling NEUROLOGIC:  Alert and oriented x 3 PSYCHIATRIC:  Normal affect   ASSESSMENT:    1. Essential hypertension   2. Paroxysmal SVT (supraventricular tachycardia) ([place])   3. Dyspnea on exertion   4. Palpitations   5. Precordial chest pain    PLAN:    In order of problems listed above:  1. Essential hypertension blood pressure well controlled continue present management. 2. Paroxysmal SVT.  Will put her on metoprolol succinate 25 mg daily. 3. Dyspnea on exertion denies having any now.  Stress test negative echocardiogram negative. 4. Palpitations controlled with small dose of beta-blocker.  [place] will be able to increase this  occasion because she does have resting bradycardia. 5. Precordial chest pain denies having any stress test negative   Medication Adjustments/Labs and Tests Ordered: Current medicines are reviewed at length with the patient today.  Concerns regarding medicines are outlined above.  No orders of the defined types were placed in this encounter.  Medication changes: No orders of the defined types were placed in this encounter.   Signed, Park Liter, MD, [facility] 08/19/2019 2:43 PM    [place]

## 2019-08-19 NOTE — Patient Instructions (Signed)
Medication Instructions:  Your physician has recommended you make the following change in your medication:   STOP: Metoprolol tartrate   START: Metoprolol succinate 25 mg daily.   If you need a refill on your cardiac medications before your next appointment, please call your pharmacy.   Lab work: None.  If you have labs (blood work) drawn today and your tests are completely normal, you will receive your results only by: Marland Kitchen MyChart Message (if you have MyChart) OR . A paper copy in the mail If you have any lab test that is abnormal or we need to change your treatment, we will call you to review the results.  Testing/Procedures: None.   Follow-Up: At [facility], you and your health needs are our priority.  As part of our continuing mission to provide you with exceptional heart care, we have created designated Provider Care Teams.  These Care Teams include your primary Cardiologist (physician) and Advanced Practice Providers (APPs -  Physician Assistants and Nurse Practitioners) who all work together to provide you with the care you need, when you need it. You will need a follow up appointment in 5 months.  Please call our office 2 months in advance to schedule this appointment.  You may see No primary care provider on file. or another member of our [employer] Provider Team in [place]: Shirlee More, MD . Jyl Heinz, MD  Any Other Special Instructions Will Be Listed Below (If Applicable).

## 2020-01-29 ENCOUNTER — Ambulatory Visit: Payer: Medicare Other | Admitting: Gastroenterology

## 2020-02-12 ENCOUNTER — Other Ambulatory Visit: Payer: Self-pay | Admitting: Cardiology

## 2020-03-02 ENCOUNTER — Ambulatory Visit (INDEPENDENT_AMBULATORY_CARE_PROVIDER_SITE_OTHER): Payer: Medicare Other | Admitting: Gastroenterology

## 2020-03-02 ENCOUNTER — Encounter: Payer: Self-pay | Admitting: Gastroenterology

## 2020-03-02 VITALS — BP 120/70 | HR 56 | Temp 97.6°F | Ht 59.0 in | Wt 148.2 lb

## 2020-03-02 DIAGNOSIS — K219 Gastro-esophageal reflux disease without esophagitis: Secondary | ICD-10-CM

## 2020-03-02 MED ORDER — PANTOPRAZOLE SODIUM 40 MG PO TBEC: 40.0000 mg | DELAYED_RELEASE_TABLET | Freq: Two times a day (BID) | ORAL | 11 refills | Status: DC

## 2020-03-02 NOTE — Patient Instructions (Signed)
Increase your pantoprazole to 40 mg twice daily.   Patient advised to avoid spicy, acidic, citrus, chocolate, mints, fruit and fruit juices.  Limit the intake of caffeine, alcohol and Soda.  Don't exercise too soon after eating.  Don't lie down within 3-4 hours of eating.  Elevate the head of your bed.  You have been scheduled for an endoscopy. Please follow written instructions given to you at your visit today. If you use inhalers (even only as needed), please bring them with you on the day of your procedure.   Thank you for choosing me and [facility].  Pricilla Riffle. Dagoberto Ligas., MD., [facility]

## 2020-03-02 NOTE — Progress Notes (Signed)
    History of Present Illness: This is an 79 year old female with a history of GERD she was evaluated and March 2020 for the same.  She was treated with pantoprazole 40 mg every morning and adding omeprazole 20 mg every afternoon if symptoms flared.  Her symptoms have been frequently flaring requiring several doses of Tums each day.  Symptoms generally bothersome after meals.  She has been taking omeprazole in the evening frequently.  Current Medications, Allergies, Past Medical History, Past Surgical History, Family History and Social History were reviewed in [employer] record.   Physical Exam: General: Well developed, well nourished, no acute distress Head: Normocephalic and atraumatic Eyes:  sclerae anicteric, EOMI Ears: Normal auditory acuity Mouth: Not examined, mask on during Covid-19 pandemic Lungs: Clear throughout to auscultation Heart: Regular rate and rhythm; no murmurs, rubs or bruits Abdomen: Soft, non tender and non distended. No masses, hepatosplenomegaly or hernias noted. Normal Bowel sounds Rectal: Not done Musculoskeletal: Symmetrical with no gross deformities  Pulses:  Normal pulses noted Extremities: No clubbing, cyanosis, edema or deformities noted Neurological: Alert oriented x 4, grossly nonfocal Psychological:  Alert and cooperative. Normal mood and affect   Assessment and Recommendations:  1.  GERD, not adequately controlled.  Intensify all antireflux measures.  Increase pantoprazole to 40 mg p.o. twice daily.  Continue Tums as needed.  Rule out esophagitis, Barrett's.  Schedule EGD. The risks (including bleeding, perforation, infection, missed lesions, medication reactions and possible hospitalization or surgery if complications occur), benefits, and alternatives to endoscopy with possible biopsy and possible dilation were discussed with the patient and they consent to proceed.   2.  CRC screening, average risk.  Last colonoscopy in 2015  showed hemorrhoids.  No plans for future screening colonoscopies due to negative colonoscopy in 2015 and age.

## 2020-03-10 ENCOUNTER — Other Ambulatory Visit: Payer: Self-pay

## 2020-03-10 ENCOUNTER — Ambulatory Visit (AMBULATORY_SURGERY_CENTER): Payer: Medicare Other | Admitting: Gastroenterology

## 2020-03-10 ENCOUNTER — Encounter: Payer: Self-pay | Admitting: Gastroenterology

## 2020-03-10 VITALS — BP 124/59 | HR 53 | Temp 96.9°F | Resp 13 | Ht 59.0 in | Wt 148.0 lb

## 2020-03-10 DIAGNOSIS — K317 Polyp of stomach and duodenum: Secondary | ICD-10-CM

## 2020-03-10 DIAGNOSIS — K2950 Unspecified chronic gastritis without bleeding: Secondary | ICD-10-CM

## 2020-03-10 DIAGNOSIS — K2970 Gastritis, unspecified, without bleeding: Secondary | ICD-10-CM

## 2020-03-10 DIAGNOSIS — K219 Gastro-esophageal reflux disease without esophagitis: Secondary | ICD-10-CM

## 2020-03-10 MED ORDER — SODIUM CHLORIDE 0.9 % IV SOLN: 500.0000 mL | Freq: Once | INTRAVENOUS | Status: DC

## 2020-03-10 NOTE — Progress Notes (Signed)
Temp taken by LC VS taken by CW

## 2020-03-10 NOTE — Progress Notes (Signed)
Called to room to assist during endoscopic procedure.  Patient ID and intended procedure confirmed with present staff. Received instructions for my participation in the procedure from the performing physician.  

## 2020-03-10 NOTE — Patient Instructions (Signed)
YOU HAD AN ENDOSCOPIC PROCEDURE TODAY AT [facility]:   Refer to the procedure report that was given to you for any specific questions about what was found during the examination.  If the procedure report does not answer your questions, please call your gastroenterologist to clarify.  If you requested that your care partner not be given the details of your procedure findings, then the procedure report has been included in a sealed envelope for you to review at your convenience later.  YOU SHOULD EXPECT: Some feelings of bloating in the abdomen. Passage of more gas than usual.  Walking can help get rid of the air that was put into your GI tract during the procedure and reduce the bloating. If you had a lower endoscopy (such as a colonoscopy or flexible sigmoidoscopy) you may notice spotting of blood in your stool or on the toilet paper. If you underwent a bowel prep for your procedure, you may not have a normal bowel movement for a few days.  Please Note:  You might notice some irritation and congestion in your nose or some drainage.  This is from the oxygen used during your procedure.  There is no need for concern and it should clear up in a day or so.  SYMPTOMS TO REPORT IMMEDIATELY:   Following upper endoscopy (EGD)  Vomiting of blood or coffee ground material  New chest pain or pain under the shoulder blades  Painful or persistently difficult swallowing  New shortness of breath  Fever of 100F or higher  Black, tarry-looking stools  For urgent or emergent issues, a gastroenterologist can be reached at any hour by calling [phone]. Do not use MyChart messaging for urgent concerns.    DIET:  We do recommend a small meal at first, but then you may proceed to your regular diet.  Drink plenty of fluids but you should avoid alcoholic beverages for 24 hours.  ACTIVITY:  You should plan to take it easy for the rest of today and you should NOT DRIVE or use heavy machinery  until tomorrow (because of the sedation medicines used during the test).    FOLLOW UP: Our staff will call the number listed on your records 48-72 hours following your procedure to check on you and address any questions or concerns that you may have regarding the information given to you following your procedure. If we do not reach you, we will leave a message.  We will attempt to reach you two times.  During this call, we will ask if you have developed any symptoms of COVID 19. If you develop any symptoms (ie: fever, flu-like symptoms, shortness of breath, cough etc.) before then, please call [phone].  If you test positive for Covid 19 in the 2 weeks post procedure, please call and report this information to [place].    If any biopsies were taken you will be contacted by phone or by letter within the next 1-3 weeks.  Please call us at [phone] if you have not heard about the biopsies in 3 weeks.    SIGNATURES/CONFIDENTIALITY: You and/or your care partner have signed paperwork which will be entered into your electronic medical record.  These signatures attest to the fact that that the information above on your After Visit Summary has been reviewed and is understood.  Full responsibility of the confidentiality of this discharge information lies with you and/or your care-partner.   RESUME MEDICATIONS. Follow up with Dr.Stark in 6 weeks . Information given  on gastritis.

## 2020-03-10 NOTE — Progress Notes (Signed)
pt tolerated well. VSS. awake and to recovery. Report given to RN. Removed bite block with ease. Atraumatic.

## 2020-03-10 NOTE — Op Note (Signed)
[place] Patient Name: Jenna Poole Procedure Date: 03/10/2020 11:10 AM MRN: [id number] Endoscopist: Ladene Artist , MD Age: 79 Referring MD:  Date of Birth: 07-Dec-1941 Gender: Female Account #: [id number] Procedure:                Upper GI endoscopy Indications:              Gastroesophageal reflux disease Medicines:                Monitored Anesthesia Care Procedure:                Pre-Anesthesia Assessment:                           - Prior to the procedure, a History and Physical                            was performed, and patient medications and                            allergies were reviewed. The patient's tolerance of                            previous anesthesia was also reviewed. The risks                            and benefits of the procedure and the sedation                            options and risks were discussed with the patient.                            All questions were answered, and informed consent                            was obtained. Prior Anticoagulants: The patient has                            taken no previous anticoagulant or antiplatelet                            agents. ASA Grade Assessment: II - A patient with                            mild systemic disease. After reviewing the risks                            and benefits, the patient was deemed in                            satisfactory condition to undergo the procedure.                           After obtaining informed consent, the endoscope was  passed under direct vision. Throughout the                            procedure, the patient's blood pressure, pulse, and                            oxygen saturations were monitored continuously. The                            Endoscope was introduced through the mouth, and                            advanced to the second part of duodenum. The upper                            GI endoscopy was  accomplished without difficulty.                            The patient tolerated the procedure well. Scope In: Scope Out: Findings:                 The examined esophagus was normal.                           Diffuse mild inflammation characterized by                            erythema, friability and granularity was found in                            the entire examined stomach. Biopsies were taken                            with a cold forceps for histology.                           Multiple 3 to 6 mm sessile polyps with no bleeding                            and no stigmata of recent bleeding were found in                            the gastric fundus and in the gastric body.                            Biopsies were taken with a cold forceps for                            histology.                           The exam of the stomach was otherwise normal.                           The duodenal  bulb and second portion of the                            duodenum were normal. Complications:            No immediate complications. Estimated Blood Loss:     Estimated blood loss was minimal. Impression:               - Normal esophagus.                           - Gastritis. Biopsied.                           - Multiple gastric polyps. Biopsied.                           - Normal duodenal bulb and second portion of the                            duodenum. Recommendation:           - Patient has a contact number available for                            emergencies. The signs and symptoms of potential                            delayed complications were discussed with the                            patient. Return to normal activities tomorrow.                            Written discharge instructions were provided to the                            patient.                           - Resume previous diet.                           - Continue present medications.                            - Await pathology results.                           - Return to GI office in 6 weeks. Ladene Artist, MD 03/10/2020 11:28:52 AM This report has been signed electronically.

## 2020-03-10 NOTE — Progress Notes (Signed)
Upon arrival in recovery large bruise noted on LFA about size of 50 cent piece,when asked pt. About it she stated "I hit out in the yard yesterday,also stated that I have been taking asa for about a year now and ever since I started taking that I have been bruising."

## 2020-03-15 ENCOUNTER — Telehealth: Payer: Self-pay

## 2020-03-15 NOTE — Telephone Encounter (Signed)
  Follow up Call-  Call back number 03/10/2020  Post procedure Call Back phone  # [phone]  Permission to leave phone message Yes  Some recent data might be hidden     Patient questions:  Do you have a fever, pain , or abdominal swelling? No. Pain Score  0 *  Have you tolerated food without any problems? Yes.    Have you been able to return to your normal activities? Yes.    Do you have any questions about your discharge instructions: Diet   No. Medications  No. Follow up visit  No.  Do you have questions or concerns about your Care? No.  Actions: * If pain score is 4 or above: No action needed, pain <4.  1. Have you developed a fever since your procedure? No  2.   Have you had an respiratory symptoms (SOB or cough) since your procedure? No  3.   Have you tested positive for COVID 19 since your procedure No  4.   Have you had any family members/close contacts diagnosed with the COVID 19 since your procedure?  No   If yes to any of these questions please route to Joylene John, RN and Erenest Rasher, RN

## 2020-03-22 ENCOUNTER — Encounter: Payer: Self-pay | Admitting: Gastroenterology

## 2020-03-22 ENCOUNTER — Telehealth: Payer: Self-pay | Admitting: Gastroenterology

## 2020-03-22 NOTE — Telephone Encounter (Signed)
I reviewed the pathology results with the patient and the letter that was mailed out today.  She will call back for any additional questions or concerns.

## 2020-03-22 NOTE — Telephone Encounter (Signed)
Patient had procedure at [facility] on 03/10/2020 seeking results

## 2020-04-16 ENCOUNTER — Ambulatory Visit (INDEPENDENT_AMBULATORY_CARE_PROVIDER_SITE_OTHER): Payer: Medicare Other | Admitting: Gastroenterology

## 2020-04-16 ENCOUNTER — Encounter: Payer: Self-pay | Admitting: Gastroenterology

## 2020-04-16 VITALS — BP 130/80 | HR 56 | Temp 98.5°F | Ht 60.0 in | Wt 145.0 lb

## 2020-04-16 DIAGNOSIS — K219 Gastro-esophageal reflux disease without esophagitis: Secondary | ICD-10-CM | POA: Diagnosis not present

## 2020-04-16 NOTE — Progress Notes (Signed)
    History of Present Illness: This is an 79 year old female with GERD.  She underwent EGD in March which showed mild chronic gastritis and several benign gastric fundic polyps.  Pantoprazole was increased to 40 mg twice daily and she intensified all antireflux measures.  Her reflux symptoms have come under much better control.  She has infrequent breakthrough heartburn symptoms.  Mild cough and throat clearing have improved.  Current Medications, Allergies, Past Medical History, Past Surgical History, Family History and Social History were reviewed in [employer] record.   Physical Exam: General: Well developed, well nourished, no acute distress Head: Normocephalic and atraumatic Eyes:  sclerae anicteric, EOMI Ears: Normal auditory acuity Mouth: Not examined, mask on during Covid-19 pandemic Lungs: Clear throughout to auscultation Heart: Regular rate and rhythm; no murmurs, rubs or bruits Abdomen: Soft, non tender and non distended. No masses, hepatosplenomegaly or hernias noted. Normal Bowel sounds Rectal: Not done Musculoskeletal: Symmetrical with no gross deformities  Pulses:  Normal pulses noted Extremities: No clubbing, cyanosis, edema or deformities noted Neurological: Alert oriented x 4, grossly nonfocal Psychological:  Alert and cooperative. Normal mood and affect   Assessment and Recommendations:  1.  GERD.  Possible LPR.  Continue pantoprazole 40 mg twice daily.  Closely follow all antireflux measures.  REV in 6 months.

## 2020-04-16 NOTE — Patient Instructions (Signed)
Stay on pantoprazole twice daily.   Thank you for choosing me and [facility].  Pricilla Riffle. Dagoberto Ligas., MD., [facility]

## 2020-05-07 ENCOUNTER — Other Ambulatory Visit: Payer: Self-pay

## 2020-05-07 MED ORDER — PANTOPRAZOLE SODIUM 40 MG PO TBEC: 40.0000 mg | DELAYED_RELEASE_TABLET | Freq: Two times a day (BID) | ORAL | 1 refills | Status: DC

## 2020-08-18 ENCOUNTER — Other Ambulatory Visit: Payer: Self-pay | Admitting: Cardiology

## 2020-09-22 ENCOUNTER — Other Ambulatory Visit: Payer: Self-pay | Admitting: Cardiology

## 2020-10-27 ENCOUNTER — Other Ambulatory Visit: Payer: Self-pay | Admitting: Cardiology

## 2020-10-27 NOTE — Telephone Encounter (Signed)
Rx sent to the pharmacy with instructions to arrange a follow up appt as soon as possible.

## 2021-01-22 ENCOUNTER — Other Ambulatory Visit: Payer: Self-pay | Admitting: Gastroenterology

## 2021-03-02 ENCOUNTER — Other Ambulatory Visit: Payer: Self-pay | Admitting: Gastroenterology

## 2021-04-14 ENCOUNTER — Telehealth: Payer: Self-pay | Admitting: Cardiology

## 2021-04-14 NOTE — Telephone Encounter (Signed)
*  STAT* If patient is at the pharmacy, call can be transferred to refill team.   1. Which medications need to be refilled? (please list name of each medication and dose if known) metoprolol succinate (TOPROL-XL) 25 MG 24 hr tablet  2. Which pharmacy/location (including street and city if local pharmacy) is medication to be sent to? [place], [place]  3. Do they need a 30 day or 90 day supply? 30 day supply

## 2021-04-15 MED ORDER — METOPROLOL SUCCINATE ER 25 MG PO TB24: ORAL_TABLET | ORAL | 0 refills | Status: DC

## 2021-04-15 NOTE — Telephone Encounter (Signed)
Medication filled. Patient scheduled for follow up appointment.

## 2021-05-05 ENCOUNTER — Ambulatory Visit: Payer: Medicare Other | Admitting: Nurse Practitioner

## 2021-05-24 ENCOUNTER — Telehealth: Payer: Self-pay | Admitting: Gastroenterology

## 2021-05-24 ENCOUNTER — Encounter: Payer: Self-pay | Admitting: Nurse Practitioner

## 2021-05-24 NOTE — Telephone Encounter (Signed)
The pt called to get advice for constipation.  She was scheduled to see Nevin Bloodgood but appt had to be rescheduled to 7/1 due to [place] schedule change.  She is passing gas but has a hard time passing stool.   The pt has been advised that she can try miralax until her appt and if she feels as if she can not wait until her appt she can follow up with PCP or urgent care.

## 2021-05-24 NOTE — Telephone Encounter (Signed)
Patient called states she feels like she is impacted and is seeking advise.

## 2021-05-25 ENCOUNTER — Encounter: Payer: Self-pay | Admitting: Cardiology

## 2021-05-25 ENCOUNTER — Ambulatory Visit (INDEPENDENT_AMBULATORY_CARE_PROVIDER_SITE_OTHER): Payer: Medicare Other | Admitting: Cardiology

## 2021-05-25 ENCOUNTER — Other Ambulatory Visit: Payer: Self-pay

## 2021-05-25 VITALS — BP 132/70 | HR 49 | Ht 60.0 in | Wt 140.0 lb

## 2021-05-25 DIAGNOSIS — R0600 Dyspnea, unspecified: Secondary | ICD-10-CM

## 2021-05-25 DIAGNOSIS — E782 Mixed hyperlipidemia: Secondary | ICD-10-CM | POA: Diagnosis not present

## 2021-05-25 DIAGNOSIS — R0609 Other forms of dyspnea: Secondary | ICD-10-CM

## 2021-05-25 DIAGNOSIS — I10 Essential (primary) hypertension: Secondary | ICD-10-CM | POA: Diagnosis not present

## 2021-05-25 DIAGNOSIS — I471 Supraventricular tachycardia: Secondary | ICD-10-CM | POA: Diagnosis not present

## 2021-05-25 MED ORDER — DILTIAZEM HCL ER COATED BEADS 120 MG PO CP24: 120.0000 mg | ORAL_CAPSULE | Freq: Every day | ORAL | 1 refills | Status: DC

## 2021-05-25 NOTE — Progress Notes (Signed)
Cardiology Office Note:    Date:  05/25/2021   ID:  Jenna Poole, DOB 12-01-1941, MRN [id number]  PCP:  Raina Mina., MD  Cardiologist:  Jenne Campus, MD    Referring MD: Raina Mina., MD   Chief Complaint  Patient presents with   Follow-up  I am doing well  History of Present Illness:    Jenna Poole is a 80 y.o. female with past medical history significant for palpitations.  She did have some atypical symptoms had quite extensive evaluation done which included echocardiography also has monitor.  Monitor shows some runs of supraventricular tachycardia.  Initially she was put on 50 mg of metoprolol for all fluid and she reduce dose to 25 it seems to be doing quite well.  She comes today 2 months of follow-up.  Overall doing well denies having palpitations there is no dizziness no passing out no chest pain tightness squeezing pressure burning chest.  Biggest complaint is pain in her back.  Past Medical History:  Diagnosis Date   Anal fissure    Anxiety    Arthritis    GERD (gastroesophageal reflux disease)    HLD (hyperlipidemia)    Hypertension    Insomnia    Interstitial cystitis     Past Surgical History:  Procedure Laterality Date   ABDOMINAL HYSTERECTOMY  1988   APPENDECTOMY  1947   BREAST SURGERY Right    benign cyst removed   ESOPHAGOGASTRODUODENOSCOPY     LUMBAR LAMINECTOMY/DECOMPRESSION MICRODISCECTOMY Left 04/19/2017   Procedure: Left Lumbar Two-Three decompressive hemilaminectomy;  Surgeon: Eustace Moore, MD;  Location: [place];  Service: Neurosurgery;  Laterality: Left;    Current Medications: Current Meds  Medication Sig   acetaminophen (TYLENOL) 500 MG tablet Take 500 mg by mouth as needed (for pain.).    ALPRAZolam (XANAX) 0.5 MG tablet Take 0.25 mg by mouth every 8 (eight) hours as needed for anxiety.    aspirin 81 MG chewable tablet Chew 81 mg by mouth daily.   atorvastatin (LIPITOR) 10 MG tablet Take 10 mg by mouth at bedtime.    ergocalciferol (VITAMIN D2) 1.25 MG (50000 UT) capsule Take 50,000 Units by mouth once a week.   fluticasone (FLONASE) 50 MCG/ACT nasal spray Place 1-2 sprays into both nostrils daily as needed for allergies.   gabapentin (NEURONTIN) 100 MG capsule Take 100 mg by mouth as needed for pain.   hydrochlorothiazide (MICROZIDE) 12.5 MG capsule Take 12.5 mg by mouth daily.   losartan (COZAAR) 50 MG tablet Take 1 tablet by mouth daily.   metoprolol succinate (TOPROL-XL) 25 MG 24 hr tablet Take 1 tablet by mouth daily.   pantoprazole (PROTONIX) 40 MG tablet Take 1 tablet (40 mg total) by mouth 2 (two) times daily.   PARoxetine (PAXIL) 10 MG tablet Take 1 tablet by mouth daily.   Polyvinyl Alcohol-Povidone (REFRESH OP) Place 1-2 drops into both eyes as needed (for dry eyes/allergy eyes.).    zolpidem (AMBIEN) 10 MG tablet Take 5-10 mg by mouth at bedtime as needed for sleep.      Allergies:   Cefazolin   Social History   Socioeconomic History   Marital status: Married    Spouse name: Not on file   Number of children: 1   Years of education: Not on file   Highest education level: Not on file  Occupational History   Occupation: retired Software engineer  Tobacco Use   Smoking status: Former    Packs/day: 0.50  Years: 10.00    Pack years: 5.00    Types: Cigarettes    Quit date: 12/12/1967    Years since quitting: 53.4   Smokeless tobacco: Never  Vaping Use   Vaping Use: Never used  Substance and Sexual Activity   Alcohol use: Not Currently    Alcohol/week: 1.0 standard drink    Types: 1 Glasses of wine per week    Comment: ocass wine   Drug use: No   Sexual activity: Not on file  Other Topics Concern   Not on file  Social History Narrative   Not on file   Social Determinants of Health   Financial Resource Strain: Not on file  Food Insecurity: Not on file  Transportation Needs: Not on file  Physical Activity: Not on file  Stress: Not on file  Social Connections: Not on file      Family History: The patient's family history includes Colon cancer in her paternal grandfather; Colon polyps in her father; Heart disease in her mother. There is no history of Rectal cancer, Stomach cancer, or Esophageal cancer. ROS:   Please see the history of present illness.    All 14 point review of systems negative except as described per history of present illness  EKGs/Labs/Other Studies Reviewed:      Recent Labs: No results found for requested labs within last 8760 hours.  Recent Lipid Panel No results found for: CHOL, TRIG, HDL, CHOLHDL, VLDL, LDLCALC, LDLDIRECT  Physical Exam:    VS:  BP 132/70 (BP Location: Right Arm, Patient Position: Sitting, Cuff Size: Normal)   Pulse (!) 49   Ht 5' (1.524 m)   Wt 140 lb (63.5 kg)   SpO2 97%   BMI 27.34 kg/m     Wt Readings from Last 3 Encounters:  05/25/21 140 lb (63.5 kg)  04/16/20 145 lb (65.8 kg)  03/10/20 148 lb (67.1 kg)     GEN:  Well nourished, well developed in no acute distress HEENT: Normal NECK: No JVD; No carotid bruits LYMPHATICS: No lymphadenopathy CARDIAC: RRR, no murmurs, no rubs, no gallops RESPIRATORY:  Clear to auscultation without rales, wheezing or rhonchi  ABDOMEN: Soft, non-tender, non-distended MUSCULOSKELETAL:  No edema; No deformity  SKIN: Warm and dry LOWER EXTREMITIES: no swelling NEUROLOGIC:  Alert and oriented x 3 PSYCHIATRIC:  Normal affect   ASSESSMENT:    1. Paroxysmal SVT (supraventricular tachycardia) ([place])   2. Essential hypertension   3. Mixed hyperlipidemia   4. Dyspnea on exertion    PLAN:    In order of problems listed above:  Supraventricular tachycardia which appears to be successfully suppressed with small dose of beta-blocker, however, her EKG today showing sinus bradycardia rate of 49 it is low.  I will discontinue beta-blocker and put her on Cardizem CD 120 mg daily. Essential hypertension blood pressure seems to be well controlled we will continue present  management. Mixed dyslipidemia she is taking Lipitor 10 mg daily, I did review data done by primary care physician with LDL of 122.  It is high however she admits that she does not take her Lipitor on the regular basis and she promised that she will do this   Medication Adjustments/Labs and Tests Ordered: Current medicines are reviewed at length with the patient today.  Concerns regarding medicines are outlined above.  No orders of the defined types were placed in this encounter.  Medication changes: No orders of the defined types were placed in this encounter.   Signed, Marily Lente.  Agustin Cree, MD, [facility] 05/25/2021 2:05 PM    [facility]

## 2021-05-25 NOTE — Patient Instructions (Signed)
Medication Instructions:  Your physician has recommended you make the following change in your medication:   STOP: Metoprolol  START: Cardizem 120 mg daily  *If you need a refill on your cardiac medications before your next appointment, please call your pharmacy*   Lab Work: None If you have labs (blood work) drawn today and your tests are completely normal, you will receive your results only by: [place] (if you have MyChart) OR A paper copy in the mail If you have any lab test that is abnormal or we need to change your treatment, we will call you to review the results.   Testing/Procedures: None   Follow-Up: At [facility], you and your health needs are our priority.  As part of our continuing mission to provide you with exceptional heart care, we have created designated Provider Care Teams.  These Care Teams include your primary Cardiologist (physician) and Advanced Practice Providers (APPs -  Physician Assistants and Nurse Practitioners) who all work together to provide you with the care you need, when you need it.  We recommend signing up for the patient portal called "MyChart".  Sign up information is provided on this After Visit Summary.  MyChart is used to connect with patients for Virtual Visits (Telemedicine).  Patients are able to view lab/test results, encounter notes, upcoming appointments, etc.  Non-urgent messages can be sent to your provider as well.   To learn more about what you can do with MyChart, go to [email or web address].    Your next appointment:   1 year(s)  The format for your next appointment:   In Person  Provider:   Jenne Campus, MD   Other Instructions  Diltiazem Extended-Release Capsules or Tablets What is this medication? DILTIAZEM (dil TYE a zem) treats high blood pressure and prevents chest pain (angina). It works by relaxing the blood vessels, which helps decrease the amount of work your heart has to do. It belongs to a  group of medicationscalled calcium channel blockers. This medicine may be used for other purposes; ask your health care provider orpharmacist if you have questions. COMMON BRAND NAME(S): Cardizem CD, Cardizem LA, Cardizem SR, Cartia XT, Dilacor XR, Dilt-CD, Diltia XT, Diltzac, Matzim LA, Rema Fendt, TIADYLT ER, Tiamate,Tiazac What should I tell my care team before I take this medication? They need to know if you have any of these conditions: Heart attack Heart disease Irregular heartbeat or rhythm Low blood pressure An unusual or allergic reaction to diltiazem, other medications, foods, dyes, or preservatives Pregnant or trying to get pregnant Breast-feeding How should I use this medication? Take this medication by mouth. Take it as directed on the prescription label at the same time every day. Do not cut, crush or chew this medication. Swallow the capsules whole. You can take it with or without food. If it upsets your stomach, take it with food. Keep taking it unless your care team tells you tostop. Talk to your care team about the use of this medication in children. Specialcare may be needed. Overdosage: If you think you have taken too much of this medicine contact apoison control center or emergency room at once. NOTE: This medicine is only for you. Do not share this medicine with others. What if I miss a dose? If you miss a dose, take it as soon as you can. If it is almost time for yournext dose, take only that dose. Do not take double or extra doses. What may interact with this medication? Do not take this  medication with any of the following: Cisapride [place] Pimozide Ranolazine Red yeast rice This medication may also interact with the following: Buspirone Carbamazepine Cimetidine Cyclosporine Digoxin Local anesthetics or general anesthetics Lovastatin Medications for anxiety or difficulty sleeping like midazolam and triazolam Medications for high blood pressure or heart  problems Quinidine Rifampin, rifabutin, or rifapentine This list may not describe all possible interactions. Give your health care provider a list of all the medicines, herbs, non-prescription drugs, or dietary supplements you use. Also tell them if you smoke, drink alcohol, or use illegaldrugs. Some items may interact with your medicine. What should I watch for while using this medication? Visit your care team for regular checks on your progress. Check your bloodpressure as directed. Ask your care team what your blood pressure should be. Do not treat yourself for coughs, colds, or pain while you are using this medication without asking your care team for advice. Some medications mayincrease your blood pressure. This medication may cause serious skin reactions. They can happen weeks to months after starting the medication. Contact your care team right away if you notice fevers or flu-like symptoms with a rash. The rash may be red or purple and then turn into blisters or peeling of the skin. Or, you might notice a red rash with swelling of the face, lips or lymph nodes in your neck or under yourarms. You may get drowsy or dizzy. Do not drive, use machinery, or do anything that needs mental alertness until you know how this medication affects you. Do not stand up or sit up quickly, especially if you are an older patient. Thisreduces the risk of dizzy or fainting spells. What side effects may I notice from receiving this medication? Side effects that you should report to your care team as soon as possible: Allergic reactions-skin rash, itching, hives, swelling of the face, lips, tongue, or throat Heart failure-shortness of breath, swelling of the ankles, feet, or hands, sudden weight gain, unusual weakness or fatigue Slow heartbeat-dizziness, feeling faint or lightheaded, trouble breathing, unusual weakness or fatigue Liver injury-right upper belly pain, loss of appetite, nausea, light-colored stool, dark  yellow or brown urine, yellowing skin or eyes, unusual weakness or fatigue Low blood pressure-dizziness, feeling faint or lightheaded, blurry vision Redness, blistering, peeling, or loosening of the skin, including inside the mouth Side effects that usually do not require medical attention (report to your careteam if they continue or are bothersome): Constipation Facial flushing, redness Headache This list may not describe all possible side effects. Call your doctor for medical advice about side effects. You may report side effects to FDA at1-800-FDA-1088. Where should I keep my medication? Keep out of the reach of children and pets. Store at room temperature between 20 and 25 degrees C (68 and 77 degrees F). Protect from moisture. Keep the container tightly closed. Throw away any unusedmedication after the expiration date. NOTE: This sheet is a summary. It may not cover all possible information. If you have questions about this medicine, talk to your doctor, pharmacist, orhealth care provider.  2022 Elsevier/Gold Standard (2021-02-08 14:10:39)

## 2021-05-26 ENCOUNTER — Ambulatory Visit: Payer: Medicare Other | Admitting: Nurse Practitioner

## 2021-06-10 ENCOUNTER — Encounter: Payer: Self-pay | Admitting: Nurse Practitioner

## 2021-06-10 ENCOUNTER — Ambulatory Visit (INDEPENDENT_AMBULATORY_CARE_PROVIDER_SITE_OTHER): Payer: Medicare Other | Admitting: Nurse Practitioner

## 2021-06-10 VITALS — BP 110/70 | HR 63 | Ht 60.0 in | Wt 140.0 lb

## 2021-06-10 DIAGNOSIS — K5900 Constipation, unspecified: Secondary | ICD-10-CM

## 2021-06-10 NOTE — Patient Instructions (Addendum)
Jenna Poole recommends that you complete a bowel purge (to clean out your bowels). Please do the following: Purchase a bottle of Miralax over the counter as well as a box of 5 mg dulcolax tablets. Take 4 dulcolax tablets. Wait 1 hour. You will then drink 6-8 capfuls of Miralax mixed in an adequate amount of water/juice/gatorade (you may choose which of these liquids to drink) over the next 2-3 hours. You should expect results within 1 to 6 hours after completing the bowel purge.  After bowel purge start Miralax 1 capful daily in 8 ounces of liquid.  Increase water to 48 ounces daily.

## 2021-06-10 NOTE — Progress Notes (Signed)
ASSESSMENT AND PLAN    # 80 yo female with bowel changes consisting of a 2 month history of constipation. Urge to have a BM is occurring less frequently. Stools consists of hard balls which are difficult to evacuate. Reason for bowel change unclear.  Some of her medications have the potential to cause constipation though she denies any recent medication changes.  She could be a little dehydrated as her water intake is inadequate and she takes a diuretic.  Colon neoplasm not excluded.  -- MiraLAX bowel purge.  Following that she will start 1 capful of MiraLAX daily.  She should reduce to every other day if having more than 2 bowel movements a day.  --She is only consuming 16 ounces of fluid daily.  Increase water intake to at least 48 ounces daily -- She will follow-up with me in early August.   Her last screening colonoscopy was August 2015( no polyps / cancers).  No plans for future screening colonoscopies.  However, depending on clinical course she may need a diagnostic colonoscopy -- Consider checking TSH at follow-up visit.    HISTORY OF PRESENT ILLNESS    Chief Complaint : Constipation  Jenna Poole is a 80 y.o. female known to Dr. Fuller Plan with a past medical history significant for GERD, gastritis, fundic gland polyps, supraventricular tachycardia, hypertension, hyperlipidemia, anxiety , hysterectomy, appendectomy see PMH below for any additional medical problems.    Patient called the office mid June to get advice for constipation.  She was advised to try MiraLAX and was given an appointment  INTERVAL HISTORY: Patient wasn't aware she was suppose to try Miralax. She has been using Glycerin suppositories instead which work " okay".  The constipation started two months ago, prior to that she was having a daily bowel movement. She generally takes colace at night.  She describes constipation as frequent urges and difficult evacuation.  She pressure sensation in her rectum. When she does  have a BM the stools consist of small balls. No recent medication / diet changes. She consumes only about 16 oz of fluid consisting mainly tof ea and water. No blood in stool. Her weigh is stable.     PREVIOUS ENDOSCOPIC EVALUATIONS / PERTINENT STUDIES:   August 2015 screening colonoscopy Complete exam, good bowel prep.  Tortuous sigmoid colon.  No polyps or cancer.  Small external hemorrhoids.  Due to age, follow-up colonoscopy not recommended   Past Medical History:  Diagnosis Date   Anal fissure    Anxiety    Arthritis    GERD (gastroesophageal reflux disease)    HLD (hyperlipidemia)    Hypertension    Insomnia    Interstitial cystitis     Current Medications, Allergies, Past Surgical History, Family History and Social History were reviewed in [employer] record.   Current Outpatient Medications  Medication Sig Dispense Refill   acetaminophen (TYLENOL) 500 MG tablet Take 500 mg by mouth as needed (for pain.).      ALPRAZolam (XANAX) 0.5 MG tablet Take 0.25 mg by mouth every 8 (eight) hours as needed for anxiety.      aspirin 81 MG chewable tablet Chew 81 mg by mouth daily.     atorvastatin (LIPITOR) 10 MG tablet Take 10 mg by mouth at bedtime.     diltiazem (CARDIZEM CD) 120 MG 24 hr capsule Take 1 capsule (120 mg total) by mouth daily. 90 capsule 1   fluticasone (FLONASE) 50 MCG/ACT nasal spray Place 1-2  sprays into both nostrils daily as needed for allergies.     gabapentin (NEURONTIN) 100 MG capsule Take 100 mg by mouth as needed for pain.     hydrochlorothiazide (MICROZIDE) 12.5 MG capsule Take 12.5 mg by mouth daily.     losartan (COZAAR) 50 MG tablet Take 1 tablet by mouth daily.     pantoprazole (PROTONIX) 40 MG tablet Take 1 tablet (40 mg total) by mouth 2 (two) times daily. 180 tablet 1   PARoxetine (PAXIL) 10 MG tablet Take 1 tablet by mouth daily.     Polyvinyl Alcohol-Povidone (REFRESH OP) Place 1-2 drops into both eyes as needed (for dry  eyes/allergy eyes.).      zolpidem (AMBIEN) 10 MG tablet Take 5-10 mg by mouth at bedtime as needed for sleep.      No current facility-administered medications for this visit.    Review of Systems: No chest pain. No shortness of breath. No urinary complaints.   PHYSICAL EXAM :    Wt Readings from Last 3 Encounters:  06/10/21 140 lb (63.5 kg)  05/25/21 140 lb (63.5 kg)  04/16/20 145 lb (65.8 kg)    Ht 5' (1.524 m)   Wt 140 lb (63.5 kg)   BMI 27.34 kg/m  Constitutional:  Pleasant female in no acute distress. Psychiatric: Normal mood and affect. Behavior is normal. EENT: Pupils normal.  Conjunctivae are normal. No scleral icterus. Neck supple.  Cardiovascular: Normal rate, regular rhythm. No edema Pulmonary/chest: Effort normal and breath sounds normal. No wheezing, rales or rhonchi. Abdominal: Soft, nondistended, nontender. Bowel sounds active throughout. There are no masses palpable. No hepatomegaly. Rectal : no masses felt on DRE. No stool in vault.  Neurological: Alert and oriented to person place and time. Skin: Skin is warm and dry. No rashes noted.  I spent 30 minutes total reviewing records, obtaining history, performing exam, counseling patient and documenting visit / findings.   Tye Savoy, NP  06/10/2021, 11:30 AM

## 2021-06-13 ENCOUNTER — Telehealth: Payer: Self-pay | Admitting: Physician Assistant

## 2021-06-13 DIAGNOSIS — K625 Hemorrhage of anus and rectum: Secondary | ICD-10-CM

## 2021-06-13 NOTE — Telephone Encounter (Signed)
I did not see an ED evaluation as of 2:40 pm this afternoon. Given her rectal bleeding she will need a colonoscopy which can be scheduled before her office follow up with PG.  Sheri, please contact the patient to schedule colonoscopy with an extended bowel prep given constipation.

## 2021-06-13 NOTE — Progress Notes (Signed)
Reviewed and agree with management plan.  Anders Hohmann T. Irlanda Croghan, MD FACG 

## 2021-06-13 NOTE — Telephone Encounter (Signed)
Telephone Call  06/13/21 9:57  Today, the patient called the on-call service on 4 July and explains that she did a bowel purge with MiraLAX under direction of Nevin Bloodgood who she saw on 06/10/2021.  This was successful for her but she had a lot of nausea and vomiting and at 8 PM last night started "leaking bright red blood" from her rectum.  Tells me that anytime she drinks water or sucks on an ice cube she has to have another episode and it is only bright red blood.  It is happened maybe 5-6 times with the last time about an hour ago.  Describes that her stomach is still "rolling/cramping" after the bowel purge.  Denies any dizziness or shortness of breath.  Advised patient that it is likely hemorrhoids but there is no way for [place] to tell this over the phone.  She needs to go to the ER for further evaluation.  Explained that we cover both [facility] and [facility] and they will call us if needed.  Ellouise Newer, PA-C

## 2021-06-14 NOTE — Telephone Encounter (Signed)
Patient has not had any solid food since the prep last bight.  Still  having some minimal bleeding.  She will come for a colonoscopy with Dr. Fuller Plan tomorrow at 1:30.  She understands to remain on a clear liquid diet today and perform a Miralax purge this pm 8 capfuls of Miralax in 32 oz of Gatorade today.  She verbalized understanding of all instructions.

## 2021-06-14 NOTE — Addendum Note (Signed)
Addended by: Marlon Pel on: 06/14/2021 10:44 AM   Modules accepted: Orders

## 2021-06-15 ENCOUNTER — Encounter: Payer: Self-pay | Admitting: Gastroenterology

## 2021-06-15 ENCOUNTER — Other Ambulatory Visit: Payer: Self-pay

## 2021-06-15 ENCOUNTER — Ambulatory Visit (AMBULATORY_SURGERY_CENTER): Payer: Medicare Other | Admitting: Gastroenterology

## 2021-06-15 VITALS — BP 127/67 | HR 63 | Temp 96.1°F | Resp 34 | Ht 60.0 in | Wt 140.0 lb

## 2021-06-15 DIAGNOSIS — K5010 Crohn's disease of large intestine without complications: Secondary | ICD-10-CM | POA: Diagnosis present

## 2021-06-15 DIAGNOSIS — K6389 Other specified diseases of intestine: Secondary | ICD-10-CM | POA: Diagnosis not present

## 2021-06-15 DIAGNOSIS — K5520 Angiodysplasia of colon without hemorrhage: Secondary | ICD-10-CM

## 2021-06-15 DIAGNOSIS — K921 Melena: Secondary | ICD-10-CM

## 2021-06-15 DIAGNOSIS — K648 Other hemorrhoids: Secondary | ICD-10-CM | POA: Diagnosis not present

## 2021-06-15 DIAGNOSIS — R194 Change in bowel habit: Secondary | ICD-10-CM

## 2021-06-15 MED ORDER — SODIUM CHLORIDE 0.9 % IV SOLN: 500.0000 mL | Freq: Once | INTRAVENOUS | Status: AC

## 2021-06-15 NOTE — Patient Instructions (Addendum)
YOU HAD AN ENDOSCOPIC PROCEDURE TODAY AT [facility]:   Refer to the procedure report that was given to you for any specific questions about what was found during the examination.  If the procedure report does not answer your questions, please call your gastroenterologist to clarify.  If you requested that your care partner not be given the details of your procedure findings, then the procedure report has been included in a sealed envelope for you to review at your convenience later.  YOU SHOULD EXPECT: Some feelings of bloating in the abdomen. Passage of more gas than usual.  Walking can help get rid of the air that was put into your GI tract during the procedure and reduce the bloating. If you had a lower endoscopy (such as a colonoscopy or flexible sigmoidoscopy) you may notice spotting of blood in your stool or on the toilet paper. If you underwent a bowel prep for your procedure, you may not have a normal bowel movement for a few days.  Please Note:  You might notice some irritation and congestion in your nose or some drainage.  This is from the oxygen used during your procedure.  There is no need for concern and it should clear up in a day or so.  SYMPTOMS TO REPORT IMMEDIATELY:  Following lower endoscopy (colonoscopy or flexible sigmoidoscopy):  Excessive amounts of blood in the stool  Significant tenderness or worsening of abdominal pains  Swelling of the abdomen that is new, acute  Fever of 100F or higher   For urgent or emergent issues, a gastroenterologist can be reached at any hour by calling [phone]. Do not use MyChart messaging for urgent concerns.    DIET:  We do recommend a small meal at first, but then you may proceed to your regular diet.  Drink plenty of fluids but you should avoid alcoholic beverages for 24 hours.  MEDICATIONS: Continue present medications. Use Miralax 1-2 times daily titrated for 1-2 complete bowel movements daily.  Please see  handouts given to you by your recovery nurse.  Thank you for allowing [place] to provide for your healthcare needs today.  ACTIVITY:  You should plan to take it easy for the rest of today and you should NOT DRIVE or use heavy machinery until tomorrow (because of the sedation medicines used during the test).    FOLLOW UP: Our staff will call the number listed on your records 48-72 hours following your procedure to check on you and address any questions or concerns that you may have regarding the information given to you following your procedure. If we do not reach you, we will leave a message.  We will attempt to reach you two times.  During this call, we will ask if you have developed any symptoms of COVID 19. If you develop any symptoms (ie: fever, flu-like symptoms, shortness of breath, cough etc.) before then, please call [phone].  If you test positive for Covid 19 in the 2 weeks post procedure, please call and report this information to [place].    If any biopsies were taken you will be contacted by phone or by letter within the next 1-3 weeks.  Please call us at [phone] if you have not heard about the biopsies in 3 weeks.    SIGNATURES/CONFIDENTIALITY: You and/or your care partner have signed paperwork which will be entered into your electronic medical record.  These signatures attest to the fact that that the information above on your After Visit Summary  has been reviewed and is understood.  Full responsibility of the confidentiality of this discharge information lies with you and/or your care-partner.

## 2021-06-15 NOTE — Progress Notes (Signed)
Report to PACU, RN, vss, BBS= Clear.  

## 2021-06-15 NOTE — Progress Notes (Signed)
Called to room to assist during endoscopic procedure.  Patient ID and intended procedure confirmed with present staff. Received instructions for my participation in the procedure from the performing physician.  

## 2021-06-15 NOTE — Progress Notes (Signed)
VS taken by C.W. 

## 2021-06-15 NOTE — Op Note (Signed)
[place] Patient Name: Jenna Poole Procedure Date: 06/15/2021 1:12 PM MRN: [id number] Endoscopist: Ladene Artist , MD Age: 80 Referring MD:  Date of Birth: 02-04-1941 Gender: Female Account #: [id number] Procedure:                Colonoscopy Indications:              Hematochezia, Change in bowel habits Medicines:                Monitored Anesthesia Care Procedure:                Pre-Anesthesia Assessment:                           - Prior to the procedure, a History and Physical                            was performed, and patient medications and                            allergies were reviewed. The patient's tolerance of                            previous anesthesia was also reviewed. The risks                            and benefits of the procedure and the sedation                            options and risks were discussed with the patient.                            All questions were answered, and informed consent                            was obtained. Prior Anticoagulants: The patient has                            taken no previous anticoagulant or antiplatelet                            agents. ASA Grade Assessment: II - A patient with                            mild systemic disease. After reviewing the risks                            and benefits, the patient was deemed in                            satisfactory condition to undergo the procedure.                           After obtaining informed consent, the colonoscope  was passed under direct vision. Throughout the                            procedure, the patient's blood pressure, pulse, and                            oxygen saturations were monitored continuously. The                            Olympus CF-HQ190L [id number]) Colonoscope was                            introduced through the anus and advanced to the the                            cecum, identified by  appendiceal orifice and                            ileocecal valve. The ileocecal valve, appendiceal                            orifice, and rectum were photographed. The quality                            of the bowel preparation was adequate. The                            colonoscopy was technically difficult and complex                            due to restricted mobility of the sigmoid colon.                            Successful completion of the procedure was aided by                            withdrawing the scope and replacing with the                            pedicatric colonoscope and then the adult                            endoscope. The patient tolerated the procedure well. Scope In: 1:27:14 PM Scope Out: 1:48:51 PM Scope Withdrawal Time: 0 hours 8 minutes 22 seconds  Total Procedure Duration: 0 hours 21 minutes 37 seconds  Findings:                 The perianal and digital rectal examinations were                            normal.                           Segmental moderate inflammation characterized by  congestion (edema), erythema, friability and                            granularity was found in the descending colon, at                            the splenic flexure and in the transverse colon.                            Biopsies were taken with a cold forceps for                            histology.                           A single small localized angiodysplastic lesion                            without bleeding was found in the cecum.                           External hemorrhoids were found during                            retroflexion. The hemorrhoids were medium-sized.                           The exam was otherwise without abnormality on                            direct and retroflexion views. Complications:            No immediate complications. Estimated blood loss:                            None. Estimated Blood Loss:      Estimated blood loss: none. 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 Biopsied.                           - A single non-bleeding colonic angiodysplastic                            lesion.                           - External hemorrhoids.                           -  The examination was otherwise normal on direct                            and retroflexion views. Recommendation:           - Patient has a contact number available for                            emergencies. The signs and symptoms of potential                            delayed complications were discussed with the                            patient. Return to normal activities tomorrow.                            Written discharge instructions were provided to the                            patient.                           - Resume previous diet.                           - Continue present medications.                           - Miralax 1-2 times daily titrated for 1-2 complete                            bowel movements daily                           - Await pathology results.                           - No repeat colonoscopy due to age and the absence                            of colonic polyps.                           - Hematochezia secondary to hemorrhoids                           - Segmetal colitis c/w ischemic colitis which                            should resolve on its own Ladene Artist, MD 06/15/2021 1:56:02 PM This report has been signed electronically.

## 2021-06-16 ENCOUNTER — Telehealth: Payer: Self-pay | Admitting: Cardiology

## 2021-06-16 NOTE — Telephone Encounter (Signed)
Pt c/o medication issue:  1. Name of Medication:  diltiazem (CARDIZEM CD) 120 MG 24 hr capsule Metoprolol 25 mg  2. How are you currently taking this medication (dosage and times per day)? 3-4 days took cardizem, started half tablet daily of metoprolol   3. Are you having a reaction (difficulty breathing--STAT)? no  4. What is your medication issue? Patient states once she started the cardizem she started getting dizzy. She states had a colonoscopy recently and was feeling dizzy then. She states she stopped the medication and then started the metoprolol again. She says she takes half a 25 mg tablet daily of the metoprolol.

## 2021-06-17 ENCOUNTER — Telehealth: Payer: Self-pay

## 2021-06-17 NOTE — Telephone Encounter (Signed)
  Follow up Call-  Call back number 06/15/2021 03/10/2020  Post procedure Call Back phone  # [phone]  Permission to leave phone message Yes Yes  Some recent data might be hidden     Patient questions:  Do you have a fever, pain , or abdominal swelling? No. Pain Score  0 *  Have you tolerated food without any problems? Yes.    Have you been able to return to your normal activities? Yes.    Do you have any questions about your discharge instructions: Diet   No. Medications  No. Follow up visit  No.  Do you have questions or concerns about your Care? No.  Actions: * If pain score is 4 or above: No action needed, pain <4.

## 2021-06-27 ENCOUNTER — Encounter: Payer: Self-pay | Admitting: Gastroenterology

## 2021-07-14 ENCOUNTER — Ambulatory Visit: Payer: Self-pay | Admitting: Nurse Practitioner

## 2021-08-09 ENCOUNTER — Ambulatory Visit: Payer: Self-pay | Admitting: Physician Assistant

## 2021-08-31 DIAGNOSIS — N3010 Interstitial cystitis (chronic) without hematuria: Secondary | ICD-10-CM | POA: Insufficient documentation

## 2021-08-31 DIAGNOSIS — K602 Anal fissure, unspecified: Secondary | ICD-10-CM | POA: Insufficient documentation

## 2021-08-31 DIAGNOSIS — K219 Gastro-esophageal reflux disease without esophagitis: Secondary | ICD-10-CM | POA: Insufficient documentation

## 2021-08-31 DIAGNOSIS — M1990 Unspecified osteoarthritis, unspecified site: Secondary | ICD-10-CM | POA: Insufficient documentation

## 2021-08-31 DIAGNOSIS — H269 Unspecified cataract: Secondary | ICD-10-CM | POA: Insufficient documentation

## 2021-09-02 ENCOUNTER — Other Ambulatory Visit: Payer: Self-pay

## 2021-09-02 ENCOUNTER — Encounter: Payer: Self-pay | Admitting: Cardiology

## 2021-09-02 ENCOUNTER — Ambulatory Visit (INDEPENDENT_AMBULATORY_CARE_PROVIDER_SITE_OTHER): Payer: Medicare Other | Admitting: Cardiology

## 2021-09-02 VITALS — BP 130/68 | HR 62 | Ht 60.0 in | Wt 136.0 lb

## 2021-09-02 DIAGNOSIS — R072 Precordial pain: Secondary | ICD-10-CM

## 2021-09-02 DIAGNOSIS — I10 Essential (primary) hypertension: Secondary | ICD-10-CM | POA: Diagnosis not present

## 2021-09-02 DIAGNOSIS — I471 Supraventricular tachycardia: Secondary | ICD-10-CM

## 2021-09-02 DIAGNOSIS — R0600 Dyspnea, unspecified: Secondary | ICD-10-CM

## 2021-09-02 DIAGNOSIS — R0609 Other forms of dyspnea: Secondary | ICD-10-CM

## 2021-09-02 NOTE — Progress Notes (Signed)
Cardiology Office Note:    Date:  09/02/2021   ID:  Jenna Poole, DOB 05-03-1941, MRN [id number]  PCP:  Raina Mina., MD  Cardiologist:  Jenne Campus, MD    Referring MD: Raina Mina., MD   Chief Complaint  Patient presents with   Follow-up  Have heartburn  History of Present Illness:    Jenna Poole is a 80 y.o. female with past medical history significant for palpitations, she was discovered to have supraventricular tachycardia initially she was put on 50 mg of metoprolol however dose had to be reduced because of bradycardia.  I try switch her to Cardizem however she did not like it she is back on 12.5 Metropol twice daily.  She also got history of essential hypertension as well as dyslipidemia.  She came to me because of atypical chest pain.  Last evaluation for coronary artery disease we will perform on her was 2 years ago which was done in form of stress test that was negative.  She said that she got indigestion indigestion happen however in different situations sometimes when she eats but sometimes with walking sometimes when she walks obviously symptoms are concerning enough that required investigation  Past Medical History:  Diagnosis Date   Anal fissure    Anxiety    Arthritis    Cataract    GERD (gastroesophageal reflux disease)    HLD (hyperlipidemia)    Hypertension    Insomnia    Interstitial cystitis     Past Surgical History:  Procedure Laterality Date   ABDOMINAL HYSTERECTOMY  1988   APPENDECTOMY  1947   BREAST SURGERY Right    benign cyst removed   ESOPHAGOGASTRODUODENOSCOPY     LUMBAR LAMINECTOMY/DECOMPRESSION MICRODISCECTOMY Left 04/19/2017   Procedure: Left Lumbar Two-Three decompressive hemilaminectomy;  Surgeon: Eustace Moore, MD;  Location: [facility];  Service: Neurosurgery;  Laterality: Left;    Current Medications: Current Meds  Medication Sig   acetaminophen (TYLENOL) 500 MG tablet Take 500 mg by mouth as needed (for pain.).     ALPRAZolam (XANAX) 0.5 MG tablet Take 0.25 mg by mouth every 8 (eight) hours as needed for anxiety.    aspirin 81 MG chewable tablet Chew 81 mg by mouth daily.   atorvastatin (LIPITOR) 10 MG tablet Take 10 mg by mouth at bedtime.   fluticasone (FLONASE) 50 MCG/ACT nasal spray Place 1-2 sprays into both nostrils daily as needed for allergies.   gabapentin (NEURONTIN) 100 MG capsule Take 100 mg by mouth as needed for pain.   hydrochlorothiazide (MICROZIDE) 12.5 MG capsule Take 12.5 mg by mouth daily.   losartan (COZAAR) 50 MG tablet Take 1 tablet by mouth daily.   metoprolol tartrate (LOPRESSOR) 25 MG tablet Take 12.5 mg by mouth daily at 12 noon.   pantoprazole (PROTONIX) 40 MG tablet Take 1 tablet (40 mg total) by mouth 2 (two) times daily.   PARoxetine (PAXIL) 10 MG tablet Take 1 tablet by mouth daily.   Polyvinyl Alcohol-Povidone (REFRESH OP) Place 1-2 drops into both eyes as needed (for dry eyes/allergy eyes.).    zolpidem (AMBIEN) 10 MG tablet Take 5-10 mg by mouth at bedtime as needed for sleep.    Current Facility-Administered Medications for the 09/02/21 encounter (Office Visit) with Park Liter, MD  Medication   0.9 %  sodium chloride infusion     Allergies:   Cefazolin   Social History   Socioeconomic History   Marital status: Married    Spouse name:  Not on file   Number of children: 1   Years of education: Not on file   Highest education level: Not on file  Occupational History   Occupation: retired Software engineer  Tobacco Use   Smoking status: Former    Packs/day: 0.50    Years: 10.00    Pack years: 5.00    Types: Cigarettes    Quit date: 12/12/1967    Years since quitting: 53.7   Smokeless tobacco: Never  Vaping Use   Vaping Use: Never used  Substance and Sexual Activity   Alcohol use: Not Currently    Alcohol/week: 1.0 standard drink    Types: 1 Glasses of wine per week    Comment: ocass wine   Drug use: No   Sexual activity: Not on file  Other Topics  Concern   Not on file  Social History Narrative   Not on file   Social Determinants of Health   Financial Resource Strain: Not on file  Food Insecurity: Not on file  Transportation Needs: Not on file  Physical Activity: Not on file  Stress: Not on file  Social Connections: Not on file     Family History: The patient's family history includes Colon cancer in her paternal grandfather; Colon polyps in her father; Heart disease in her mother. There is no history of Rectal cancer, Stomach cancer, or Esophageal cancer. ROS:   Please see the history of present illness.    All 14 point review of systems negative except as described per history of present illness  EKGs/Labs/Other Studies Reviewed:      Recent Labs: No results found for requested labs within last 8760 hours.  Recent Lipid Panel No results found for: CHOL, TRIG, HDL, CHOLHDL, VLDL, LDLCALC, LDLDIRECT  Physical Exam:    VS:  BP 130/68 (BP Location: Right Arm, Patient Position: Sitting, Cuff Size: Normal)   Pulse 62   Ht 5' (1.524 m)   Wt 136 lb (61.7 kg)   SpO2 96%   BMI 26.56 kg/m     Wt Readings from Last 3 Encounters:  09/02/21 136 lb (61.7 kg)  06/15/21 140 lb (63.5 kg)  06/10/21 140 lb (63.5 kg)     GEN:  Well nourished, well developed in no acute distress HEENT: Normal NECK: No JVD; No carotid bruits LYMPHATICS: No lymphadenopathy CARDIAC: RRR, no murmurs, no rubs, no gallops RESPIRATORY:  Clear to auscultation without rales, wheezing or rhonchi  ABDOMEN: Soft, non-tender, non-distended MUSCULOSKELETAL:  No edema; No deformity  SKIN: Warm and dry LOWER EXTREMITIES: no swelling NEUROLOGIC:  Alert and oriented x 3 PSYCHIATRIC:  Normal affect   ASSESSMENT:    1. Paroxysmal SVT (supraventricular tachycardia) ([place])   2. Dyspnea on exertion   3. Precordial chest pain   4. Essential hypertension    PLAN:    In order of problems listed above:  Supraventricular tachycardia denies having any  small dose of beta-blocker seems to be tolerating well we will continue Indigestion/precordial chest pain.  Lexiscan will be performed. Essential hypertension blood pressure seems to be well controlled continue present management. Dyspnea and exertion again stress test will be done first   Medication Adjustments/Labs and Tests Ordered: Current medicines are reviewed at length with the patient today.  Concerns regarding medicines are outlined above.  Orders Placed This Encounter  Procedures   MYOCARDIAL PERFUSION IMAGING   Medication changes: No orders of the defined types were placed in this encounter.   Signed, Park Liter, MD, [facility] 09/02/2021 3:44  PM    [facility]

## 2021-09-02 NOTE — Patient Instructions (Signed)
Medication Instructions:  Your physician recommends that you continue on your current medications as directed. Please refer to the Current Medication list given to you today.  *If you need a refill on your cardiac medications before your next appointment, please call your pharmacy*   Lab Work: None If you have labs (blood work) drawn today and your tests are completely normal, you will receive your results only by: [place] (if you have MyChart) OR A paper copy in the mail If you have any lab test that is abnormal or we need to change your treatment, we will call you to review the results.   Testing/Procedures:   [facility] [address] [place], [place] [address] Phone:  [phone]    Please arrive 15 minutes prior to your appointment time for registration and insurance purposes.  The test will take approximately 3 to 4 hours to complete; you may bring reading material.  If someone comes with you to your appointment, they will need to remain in the main lobby due to limited space in the testing area. **If you are pregnant or breastfeeding, please notify the nuclear lab prior to your appointment**  How to prepare for your Myocardial Perfusion Test: Do not eat or drink 3 hours prior to your test, except you may have water. Do not consume products containing caffeine (regular or decaffeinated) 12 hours prior to your test. (ex: coffee, chocolate, sodas, tea). Do bring a list of your current medications with you.  If not listed below, you may take your medications as normal. Do wear comfortable clothes (no dresses or overalls) and walking shoes, tennis shoes preferred (No heels or open toe shoes are allowed). Do NOT wear cologne, perfume, aftershave, or lotions (deodorant is allowed). If these instructions are not followed, your test will have to be rescheduled.  Please report to [address] for your test.  If you have questions or  concerns about your appointment, you can call the [facility] at [phone].  If you cannot keep your appointment, please provide 24 hours notification to the Nuclear Lab, to avoid a possible $50 charge to your account.    Follow-Up: At [facility], you and your health needs are our priority.  As part of our continuing mission to provide you with exceptional heart care, we have created designated Provider Care Teams.  These Care Teams include your primary Cardiologist (physician) and Advanced Practice Providers (APPs -  Physician Assistants and Nurse Practitioners) who all work together to provide you with the care you need, when you need it.  We recommend signing up for the patient portal called "MyChart".  Sign up information is provided on this After Visit Summary.  MyChart is used to connect with patients for Virtual Visits (Telemedicine).  Patients are able to view lab/test results, encounter notes, upcoming appointments, etc.  Non-urgent messages can be sent to your provider as well.   To learn more about what you can do with MyChart, go to [email or web address].    Your next appointment:   5 month(s)  The format for your next appointment:   In Person  Provider:   Jenne Campus, MD   Other Instructions

## 2021-09-05 ENCOUNTER — Telehealth (HOSPITAL_COMMUNITY): Payer: Self-pay

## 2021-09-06 ENCOUNTER — Ambulatory Visit (INDEPENDENT_AMBULATORY_CARE_PROVIDER_SITE_OTHER): Payer: Medicare Other

## 2021-09-06 ENCOUNTER — Other Ambulatory Visit: Payer: Self-pay

## 2021-09-06 DIAGNOSIS — R0609 Other forms of dyspnea: Secondary | ICD-10-CM

## 2021-09-06 DIAGNOSIS — R0600 Dyspnea, unspecified: Secondary | ICD-10-CM

## 2021-09-06 DIAGNOSIS — I471 Supraventricular tachycardia: Secondary | ICD-10-CM

## 2021-09-06 MED ORDER — REGADENOSON 0.4 MG/5ML IV SOLN
0.4000 mg | Freq: Once | INTRAVENOUS | Status: AC
  Administered 2021-09-06: 0.4 mg via INTRAVENOUS

## 2021-09-06 MED ORDER — TECHNETIUM TC 99M TETROFOSMIN IV KIT
10.9000 | PACK | Freq: Once | INTRAVENOUS | Status: AC | PRN
  Administered 2021-09-06: 10.9 via INTRAVENOUS

## 2021-09-06 MED ORDER — TECHNETIUM TC 99M TETROFOSMIN IV KIT
31.9000 | PACK | Freq: Once | INTRAVENOUS | Status: AC | PRN
  Administered 2021-09-06: 31.9 via INTRAVENOUS

## 2021-09-07 LAB — MYOCARDIAL PERFUSION IMAGING
LV dias vol: 40 mL (ref 46–106)
LV sys vol: 8 mL
Nuc Stress EF: 81 %
Rest Nuclear Isotope Dose: 10.9 mCi
SDS: 0
SRS: 1
SSS: 1
ST Depression (mm): 0 mm
Stress Nuclear Isotope Dose: 31.9 mCi
TID: 1.08

## 2021-09-12 ENCOUNTER — Ambulatory Visit: Payer: Medicare Other | Admitting: Physician Assistant

## 2021-09-22 ENCOUNTER — Other Ambulatory Visit: Payer: Self-pay

## 2021-09-23 MED ORDER — METOPROLOL TARTRATE 25 MG PO TABS: 12.5000 mg | ORAL_TABLET | Freq: Every day | ORAL | 3 refills | Status: DC

## 2021-10-11 DIAGNOSIS — E559 Vitamin D deficiency, unspecified: Secondary | ICD-10-CM | POA: Insufficient documentation

## 2021-10-14 ENCOUNTER — Other Ambulatory Visit (INDEPENDENT_AMBULATORY_CARE_PROVIDER_SITE_OTHER): Payer: Medicare Other

## 2021-10-14 ENCOUNTER — Encounter: Payer: Self-pay | Admitting: Physician Assistant

## 2021-10-14 ENCOUNTER — Ambulatory Visit (INDEPENDENT_AMBULATORY_CARE_PROVIDER_SITE_OTHER): Payer: Medicare Other | Admitting: Physician Assistant

## 2021-10-14 VITALS — BP 134/60 | HR 60 | Ht 58.75 in | Wt 136.0 lb

## 2021-10-14 DIAGNOSIS — K219 Gastro-esophageal reflux disease without esophagitis: Secondary | ICD-10-CM | POA: Diagnosis not present

## 2021-10-14 DIAGNOSIS — K5900 Constipation, unspecified: Secondary | ICD-10-CM

## 2021-10-14 LAB — TSH: TSH: 2.81 u[IU]/mL (ref 0.35–5.50)

## 2021-10-14 MED ORDER — LINACLOTIDE 72 MCG PO CAPS: 72.0000 ug | ORAL_CAPSULE | Freq: Every day | ORAL | 4 refills | Status: DC

## 2021-10-14 MED ORDER — OMEPRAZOLE 40 MG PO CPDR: 40.0000 mg | DELAYED_RELEASE_CAPSULE | Freq: Two times a day (BID) | ORAL | 5 refills | Status: DC

## 2021-10-14 NOTE — Progress Notes (Addendum)
Chief Complaint: Follow-up constipation and new complaint of GERD  HPI:    Jenna Poole is an 80 year old Caucasian female with a past medical history as listed below including reflux, known to Dr. Fuller Plan, who presents to clinic today for follow-up of her constipation and a complaint of GERD.    07/2014 colonoscopy was a complete exam with good bowel prep, tortuous sigmoid colon.  No polyps or cancer.  Small external hemorrhoids.  No follow-up recommended.    03/10/2020 EGD with gastritis and gastric polyps.  Pathology showed mild chronic gastritis with no H. pylori.    06/10/2021 patient seen in clinic by Tye Savoy and at that time she had a 57-monthhistory of constipation, recommended to do a MiraLAX bowel purge and then start MiraLAX once daily.  Also encouraged to drink more fluids.  Discussed possible colonoscopy if she continues symptoms and checking a TSH at follow-up.  (05/19/2021 TSH normal)    06/13/2021 patient called on service and describes some bloody bowel movements and vomiting after trying the MiraLAX bowel purge.  She was advised to go to the ED.  Does not look like she ever went.  Instead Dr. SFuller Plansaw phone message and scheduled her for direct colonoscopy.    06/15/2021 colonoscopy with segmental moderate inflammation in the descending colon, splenic flexure and transverse colon secondary to suspected ischemic colitis, single nonbleeding colonic angiodysplastic lesion, external hemorrhoids and otherwise normal.  Patient was told to use MiraLAX 1-2 times daily titrated for 1-2 complete bowel movements, hematochezia secondary to hemorrhoids.  Biopsies confirmed ischemic colitis.    Today, the patient presents to clinic and tells me that she did take the MiraLAX once daily for about a month after being seen in the hospital for ischemic colitis but "I do not like taking it".  Tells me she just did not like the taste of it it made her nauseous/want to gag.  She was having better bowel movements  with this daily but discontinued it.  Instead she has been using an over-the-counter stool softener daily and this is not really working for her with continued small balls of stool.  Denies generalized abdominal pain or nausea.    Also reports that her reflux has been uncontrolled over the past few months.  This is regardless of her Pantoprazole 40 mg twice daily which she has been on for the past year and a half since having an EGD with reflux.    Also discussed a "spot" near her rectum which is a little bit painful, she has been putting antibiotic cream on it.    Denies fever, chills, weight loss, further blood in her stool, nausea or vomiting.  Past Medical History:  Diagnosis Date   Anal fissure    Anxiety    Arthritis    Cataract    GERD (gastroesophageal reflux disease)    HLD (hyperlipidemia)    Hypertension    Insomnia    Interstitial cystitis     Past Surgical History:  Procedure Laterality Date   ABDOMINAL HYSTERECTOMY  1988   APPENDECTOMY  1947   BREAST SURGERY Right    benign cyst removed   ESOPHAGOGASTRODUODENOSCOPY     LUMBAR LAMINECTOMY/DECOMPRESSION MICRODISCECTOMY Left 04/19/2017   Procedure: Left Lumbar Two-Three decompressive hemilaminectomy;  Surgeon: JEustace Moore MD;  Location[place] [facility]  Service: Neurosurgery;  Laterality: Left;    Current Outpatient Medications  Medication Sig Dispense Refill   acetaminophen (TYLENOL) 500 MG tablet Take 500 mg by mouth as  needed (for pain.).      ALPRAZolam (XANAX) 0.5 MG tablet Take 0.25 mg by mouth every 8 (eight) hours as needed for anxiety.      aspirin 81 MG chewable tablet Chew 81 mg by mouth daily.     atorvastatin (LIPITOR) 10 MG tablet Take 10 mg by mouth at bedtime.     fluticasone (FLONASE) 50 MCG/ACT nasal spray Place 1-2 sprays into both nostrils daily as needed for allergies.     gabapentin (NEURONTIN) 100 MG capsule Take 100 mg by mouth as needed for pain.     hydrochlorothiazide (MICROZIDE) 12.5 MG capsule  Take 12.5 mg by mouth daily.     losartan (COZAAR) 50 MG tablet Take 1 tablet by mouth daily.     metoprolol tartrate (LOPRESSOR) 25 MG tablet Take 0.5 tablets (12.5 mg total) by mouth daily at 12 noon. 45 tablet 3   pantoprazole (PROTONIX) 40 MG tablet Take 1 tablet (40 mg total) by mouth 2 (two) times daily. 180 tablet 1   PARoxetine (PAXIL) 10 MG tablet Take 1 tablet by mouth daily.     Polyvinyl Alcohol-Povidone (REFRESH OP) Place 1-2 drops into both eyes as needed (for dry eyes/allergy eyes.).      zolpidem (AMBIEN) 10 MG tablet Take 5-10 mg by mouth at bedtime as needed for sleep.      Current Facility-Administered Medications  Medication Dose Route Frequency Provider Last Rate Last Admin   0.9 %  sodium chloride infusion  500 mL Intravenous Once Ladene Artist, MD        Allergies as of 10/14/2021 - Review Complete 10/14/2021  Allergen Reaction Noted   Cefazolin Hives 04/19/2017    Family History  Problem Relation Age of Onset   Colon polyps Father    Heart disease Mother        a-fib   Colon cancer Paternal Grandfather    Rectal cancer Neg Hx    Stomach cancer Neg Hx    Esophageal cancer Neg Hx     Social History   Socioeconomic History   Marital status: Married    Spouse name: Not on file   Number of children: 1   Years of education: Not on file   Highest education level: Not on file  Occupational History   Occupation: retired Software engineer  Tobacco Use   Smoking status: Former    Packs/day: 0.50    Years: 10.00    Pack years: 5.00    Types: Cigarettes    Quit date: 12/12/1967    Years since quitting: 53.8   Smokeless tobacco: Never  Vaping Use   Vaping Use: Never used  Substance and Sexual Activity   Alcohol use: Not Currently    Alcohol/week: 1.0 standard drink    Types: 1 Glasses of wine per week    Comment: ocass wine   Drug use: No   Sexual activity: Not on file  Other Topics Concern   Not on file  Social History Narrative   Not on file    Social Determinants of Health   Financial Resource Strain: Not on file  Food Insecurity: Not on file  Transportation Needs: Not on file  Physical Activity: Not on file  Stress: Not on file  Social Connections: Not on file  Intimate Partner Violence: Not on file    Review of Systems:    Constitutional: No weight loss, fever or chills Cardiovascular: No chest pain Respiratory: No SOB  Gastrointestinal: See HPI and otherwise negative  Physical Exam:  Vital signs: BP 134/60 (BP Location: Left Arm, Patient Position: Sitting, Cuff Size: Normal)   Pulse 60   Ht 4' 10.75" (1.492 m) Comment: height measured without shoes  Wt 136 lb (61.7 kg)   BMI 27.70 kg/m   Constitutional:   Pleasant elderly Caucasian female appears to be in NAD, Well developed, Well nourished, alert and cooperative Respiratory: Respirations even and unlabored. Lungs clear to auscultation bilaterally.   No wheezes, crackles, or rhonchi.  Cardiovascular: Normal S1, S2. No MRG. Regular rate and rhythm. No peripheral edema, cyanosis or pallor.  Gastrointestinal:  Soft, nondistended, nontender. No rebound or guarding. Normal bowel sounds. No appreciable masses or hepatomegaly. Rectal: External exam: About 1 inch away from the rectum on the left buttock is an infected hair follicle/whitehead, no erythema Psychiatric: Oriented to person, place and time. Demonstrates good judgement and reason without abnormal affect or behaviors.  No recent labs.  Assessment: 1.  Constipation: Was better with MiraLAX, did not like the taste, recent colonoscopy with ischemic colitis; most likely age/decreased fluid intake 2.  GERD: Increase in symptoms over the past couple of months regardless of Pantoprazole which patient has been on for the past year and a half; consider tolerance to Pantoprazole versus other 3.  Infected hair follicle  Plan: 1.  Stop Pantoprazole.  Prescribed Omeprazole 40 mg twice daily, 30-60 minutes before  breakfast and dinner.  #60 with 5 refills. 2.  If above does not help could consider adding Pepcid 20 mg twice daily and/or Carafate 3.  Patient does not like the taste of MiraLAX, though it was seeming to work for her.  She would like to try Linzess 72 mcg daily.  Provided her with samples today and a prescription #30 with 3 refills.  Discussed that if this is too strong for her she can take it every other day. 4.  Patient wanted me to examine a spot near her rectum which appears to be an infected hair follicle, there is no erythema, assume this will get better over time, recommended hot compresses. 5.  Did recheck TSH today, this was normal in June so likely this is not the cause.  But we will confirm. 6.  Patient to follow in clinic in 2 months.  She was put in recall.  Ellouise Newer, PA-C [facility] 10/14/2021, 11:32 AM  Cc: Jenna Poole., MD

## 2021-10-14 NOTE — Progress Notes (Signed)
Reviewed and agree with management plan.  Hicks Feick T. Lakena Sparlin, MD FACG 

## 2021-10-14 NOTE — Patient Instructions (Addendum)
Your provider has requested that you go to the basement level for lab work before leaving today. Press "B" on the elevator. The lab is located at the first door on the left as you exit the elevator.  We have sent the following medications to your pharmacy for you to pick up at your convenience: Linzess 72 mcg daily before breakfast.  Omeprazole 40 mg twice daily 30-60 minutes before breakfast and dinner.   If you are age 81 or older, your body mass index should be between 23-30. Your Body mass index is 27.7 kg/m. If this is out of the aforementioned range listed, please consider follow up with your Primary Care Provider.  If you are age 55 or younger, your body mass index should be between 19-25. Your Body mass index is 27.7 kg/m. If this is out of the aformentioned range listed, please consider follow up with your Primary Care Provider.   ________________________________________________________  The [facility] providers would like to encourage you to use [facility] to communicate with providers for non-urgent requests or questions.  Due to long hold times on the telephone, sending your provider a message by [facility] may be a faster and more efficient way to get a response.  Please allow 48 business hours for a response.  Please remember that this is for non-urgent requests.  _______________________________________________________

## 2021-12-26 NOTE — Telephone Encounter (Signed)
err

## 2022-01-09 ENCOUNTER — Ambulatory Visit (INDEPENDENT_AMBULATORY_CARE_PROVIDER_SITE_OTHER): Payer: Medicare Other

## 2022-01-09 ENCOUNTER — Ambulatory Visit (INDEPENDENT_AMBULATORY_CARE_PROVIDER_SITE_OTHER): Payer: Medicare Other | Admitting: Podiatry

## 2022-01-09 ENCOUNTER — Other Ambulatory Visit: Payer: Self-pay | Admitting: Podiatry

## 2022-01-09 ENCOUNTER — Encounter: Payer: Self-pay | Admitting: Podiatry

## 2022-01-09 ENCOUNTER — Other Ambulatory Visit: Payer: Self-pay

## 2022-01-09 DIAGNOSIS — M7741 Metatarsalgia, right foot: Secondary | ICD-10-CM | POA: Diagnosis not present

## 2022-01-09 DIAGNOSIS — M778 Other enthesopathies, not elsewhere classified: Secondary | ICD-10-CM

## 2022-01-09 DIAGNOSIS — M21961 Unspecified acquired deformity of right lower leg: Secondary | ICD-10-CM | POA: Diagnosis not present

## 2022-01-09 DIAGNOSIS — M2031 Hallux varus (acquired), right foot: Secondary | ICD-10-CM

## 2022-01-09 DIAGNOSIS — M7742 Metatarsalgia, left foot: Secondary | ICD-10-CM | POA: Diagnosis not present

## 2022-01-09 DIAGNOSIS — M21962 Unspecified acquired deformity of left lower leg: Secondary | ICD-10-CM

## 2022-01-16 NOTE — Progress Notes (Signed)
°  Subjective:  Patient ID: Jenna Poole, female    DOB: 12/19/1940,  MRN: [id number]  Chief Complaint  Patient presents with   Foot Pain    The balls of both are red and there is not any swelling and does hurt with walking and the two big toes the back of them have little splits in them   81 y.o. female presents with the above complaint. History confirmed with patient.   Objective:  Physical Exam: warm, good capillary refill, no trophic changes or ulcerative lesions, normal DP and PT pulses, and normal sensory exam. Left Foot: Mild hallux varus deformity with medial deviation of digits 1 to Right Foot: normal exam, no swelling, tenderness, instability; ligaments intact, full range of motion of all ankle/foot joints  General xerosis  No images are attached to the encounter.  Radiographs: X-ray of both feet:  Left foot hallux varus deformity With large subchondral cysts first metatarsal phalangeal joint.  Windswept deformity of the toes.  Elongated second metatarsal.  Right foot with lesser digital contractures first ray rather rectus.  Elongated second metatarsal. Assessment:   1. Capsulitis of foot, unspecified laterality   2. Acquired hallux varus of right foot   3. Metatarsalgia of both feet   4. Deformity of metatarsal bone of right foot   5. Metatarsal deformity, left      Plan:  Patient was evaluated and treated and all questions answered.  Capsulitis, metatarsalgia -Educated on etiology -Discussed shoe gear including shoes with ample padding in the forefoot.  Dispensed metatarsal pads.  Follow-up should pain persist.  She may benefit from second metatarsal osteotomy bilaterally and repair of hallux varus deformity -Discussed use of moisturizing cream to keep the skin well hydrated  No follow-ups on file.

## 2022-02-09 ENCOUNTER — Ambulatory Visit: Payer: Medicare Other | Admitting: Cardiology

## 2022-04-20 ENCOUNTER — Other Ambulatory Visit: Payer: Self-pay

## 2022-04-20 MED ORDER — OMEPRAZOLE 40 MG PO CPDR: 40.0000 mg | DELAYED_RELEASE_CAPSULE | Freq: Two times a day (BID) | ORAL | 0 refills | Status: DC

## 2022-07-20 ENCOUNTER — Encounter: Payer: Self-pay | Admitting: Cardiology

## 2022-07-20 ENCOUNTER — Ambulatory Visit (INDEPENDENT_AMBULATORY_CARE_PROVIDER_SITE_OTHER): Payer: Medicare Other | Admitting: Cardiology

## 2022-07-20 VITALS — BP 120/68 | HR 55 | Ht 60.0 in | Wt 136.6 lb

## 2022-07-20 DIAGNOSIS — I471 Supraventricular tachycardia: Secondary | ICD-10-CM

## 2022-07-20 DIAGNOSIS — R072 Precordial pain: Secondary | ICD-10-CM

## 2022-07-20 DIAGNOSIS — K2100 Gastro-esophageal reflux disease with esophagitis, without bleeding: Secondary | ICD-10-CM

## 2022-07-20 DIAGNOSIS — R0609 Other forms of dyspnea: Secondary | ICD-10-CM

## 2022-07-20 DIAGNOSIS — R002 Palpitations: Secondary | ICD-10-CM

## 2022-07-20 DIAGNOSIS — I10 Essential (primary) hypertension: Secondary | ICD-10-CM

## 2022-07-20 NOTE — Patient Instructions (Signed)
Medication Instructions:  Your physician recommends that you continue on your current medications as directed. Please refer to the Current Medication list given to you today.  *If you need a refill on your cardiac medications before your next appointment, please call your pharmacy*   Lab Work: None If you have labs (blood work) drawn today and your tests are completely normal, you will receive your results only by: [place] (if you have MyChart) OR A paper copy in the mail If you have any lab test that is abnormal or we need to change your treatment, we will call you to review the results.   Testing/Procedures: None   Follow-Up: At [facility], you and your health needs are our priority.  As part of our continuing mission to provide you with exceptional heart care, we have created designated Provider Care Teams.  These Care Teams include your primary Cardiologist (physician) and Advanced Practice Providers (APPs -  Physician Assistants and Nurse Practitioners) who all work together to provide you with the care you need, when you need it.  We recommend signing up for the patient portal called "MyChart".  Sign up information is provided on this After Visit Summary.  MyChart is used to connect with patients for Virtual Visits (Telemedicine).  Patients are able to view lab/test results, encounter notes, upcoming appointments, etc.  Non-urgent messages can be sent to your provider as well.   To learn more about what you can do with MyChart, go to [email or web address].    Your next appointment:   1 year(s)  The format for your next appointment:   In Person  Provider:   Jenne Campus, MD    Other Instructions   Important Information About Sugar

## 2022-07-20 NOTE — Progress Notes (Signed)
Cardiology Office Note:    Date:  07/20/2022   ID:  Jenna Poole, DOB 03-05-1941, MRN [id number]  PCP:  Jenna Mina., MD  Cardiologist:  Jenne Campus, MD    Referring MD: Jenna Mina., MD   Chief Complaint  Patient presents with   Follow-up  Cardiac wise I am doing fine but back is getting new problems  History of Present Illness:    Jenna Poole is a 81 y.o. female with past medical history significant for palpitations that she did wore monitor which showed some atrial tachycardia versus supraventricular tachycardia.  Initially she was put on 50 mg metoprolol titrate however did not feel well because of bradycardia then we will switch her to Cardizem event she is she end up being put back on very small dose of metoprolol only 12.5 twice daily seems to be doing well.  Also essential hypertension dyslipidemia chronic back problem atypical chest pain.  Last time I seen her she did have atypical chest pain she ended up having stress test done which showed no evidence of ischemia. She is in my office today doing well denies have any chest pain tightness squeezing pressure burning chest.  Complain of having some chronic back problem.  Past Medical History:  Diagnosis Date   Anal fissure    Anxiety    Arthritis    Cataract    GERD (gastroesophageal reflux disease)    HLD (hyperlipidemia)    Hypertension    Insomnia    Interstitial cystitis     Past Surgical History:  Procedure Laterality Date   ABDOMINAL HYSTERECTOMY  1988   APPENDECTOMY  1947   BREAST SURGERY Right    benign cyst removed   ESOPHAGOGASTRODUODENOSCOPY     LUMBAR LAMINECTOMY/DECOMPRESSION MICRODISCECTOMY Left 04/19/2017   Procedure: Left Lumbar Two-Three decompressive hemilaminectomy;  Surgeon: Eustace Moore, MD;  Location: [facility];  Service: Neurosurgery;  Laterality: Left;    Current Medications: Current Meds  Medication Sig   acetaminophen (TYLENOL) 500 MG tablet Take 500 mg by mouth as needed  for mild pain or moderate pain (for pain.).   ALPRAZolam (XANAX) 0.5 MG tablet Take 0.25 mg by mouth every 8 (eight) hours as needed for anxiety.    aspirin 81 MG chewable tablet Chew 81 mg by mouth daily.   atorvastatin (LIPITOR) 10 MG tablet Take 10 mg by mouth at bedtime.   atorvastatin (LIPITOR) 10 MG tablet Take 1 tablet by mouth daily.   fluticasone (FLONASE) 50 MCG/ACT nasal spray Place 1-2 sprays into both nostrils daily as needed for allergies.   gabapentin (NEURONTIN) 100 MG capsule Take 100 mg by mouth as needed for pain.   hydrochlorothiazide (MICROZIDE) 12.5 MG capsule Take 12.5 mg by mouth daily.   losartan (COZAAR) 50 MG tablet Take 1 tablet by mouth daily.   metoprolol tartrate (LOPRESSOR) 25 MG tablet Take 0.5 tablets (12.5 mg total) by mouth daily at 12 noon.   omeprazole (PRILOSEC) 40 MG capsule Take 1 capsule (40 mg total) by mouth 2 (two) times daily. **PLEASE CONTACT OFFICE TO SCHEDULE FOLLOW UP   PARoxetine (PAXIL) 10 MG tablet Take 1 tablet by mouth daily.   Polyvinyl Alcohol-Povidone (REFRESH OP) Place 1-2 drops into both eyes as needed (for dry eyes/allergy eyes.).    zolpidem (AMBIEN) 10 MG tablet Take 5-10 mg by mouth at bedtime as needed for sleep.    [DISCONTINUED] linaclotide (LINZESS) 72 MCG capsule Take 1 capsule (72 mcg total) by mouth daily before  breakfast.   Current Facility-Administered Medications for the 07/20/22 encounter (Office Visit) with Park Liter, MD  Medication   0.9 %  sodium chloride infusion     Allergies:   Cefazolin   Social History   Socioeconomic History   Marital status: Married    Spouse name: Not on file   Number of children: 1   Years of education: Not on file   Highest education level: Not on file  Occupational History   Occupation: retired Software engineer  Tobacco Use   Smoking status: Former    Packs/day: 0.50    Years: 10.00    Total pack years: 5.00    Types: Cigarettes    Quit date: 12/12/1967    Years since  quitting: 54.6   Smokeless tobacco: Never  Vaping Use   Vaping Use: Never used  Substance and Sexual Activity   Alcohol use: Not Currently    Alcohol/week: 1.0 standard drink of alcohol    Types: 1 Glasses of wine per week    Comment: ocass wine   Drug use: No   Sexual activity: Not on file  Other Topics Concern   Not on file  Social History Narrative   Not on file   Social Determinants of Health   Financial Resource Strain: Not on file  Food Insecurity: Not on file  Transportation Needs: Not on file  Physical Activity: Not on file  Stress: Not on file  Social Connections: Not on file     Family History: The patient's family history includes Colon cancer in her paternal grandfather; Colon polyps in her father; Heart disease in her mother. There is no history of Rectal cancer, Stomach cancer, or Esophageal cancer. ROS:   Please see the history of present illness.    All 14 point review of systems negative except as described per history of present illness  EKGs/Labs/Other Studies Reviewed:      Recent Labs: 10/14/2021: TSH 2.81  Recent Lipid Panel No results found for: "CHOL", "TRIG", "HDL", "CHOLHDL", "VLDL", "LDLCALC", "LDLDIRECT"  Physical Exam:    VS:  BP 120/68 (BP Location: Left Arm, Patient Position: Sitting)   Pulse (!) 55   Ht 5' (1.524 m)   Wt 136 lb 9.6 oz (62 kg)   SpO2 96%   BMI 26.68 kg/m     Wt Readings from Last 3 Encounters:  07/20/22 136 lb 9.6 oz (62 kg)  10/14/21 136 lb (61.7 kg)  09/06/21 136 lb (61.7 kg)     GEN:  Well nourished, well developed in no acute distress HEENT: Normal NECK: No JVD; No carotid bruits LYMPHATICS: No lymphadenopathy CARDIAC: RRR, no murmurs, no rubs, no gallops RESPIRATORY:  Clear to auscultation without rales, wheezing or rhonchi  ABDOMEN: Soft, non-tender, non-distended MUSCULOSKELETAL:  No edema; No deformity  SKIN: Warm and dry LOWER EXTREMITIES: no swelling NEUROLOGIC:  Alert and oriented x  3 PSYCHIATRIC:  Normal affect   ASSESSMENT:    1. Paroxysmal SVT (supraventricular tachycardia) ([place])   2. Essential hypertension   3. Gastroesophageal reflux disease with esophagitis without hemorrhage   4. Palpitations   5. Dyspnea on exertion   6. Precordial chest pain    PLAN:    In order of problems listed above:  Paroxysmal SVT.  Successfully suppressed with small dose of beta-blocker she is asymptomatic we will continue present management. Essential hypertension blood pressure well-controlled we will continue present management Dyslipidemia, her last LDL done by primary care physician showed 101.  Is reasonable cholesterol  she is on Lipitor 10 mg daily which I will continue Dyspnea exertion: Denies having any will prevent her from exercising his back pain. Precordial chest pain.  Denies having any   Medication Adjustments/Labs and Tests Ordered: Current medicines are reviewed at length with the patient today.  Concerns regarding medicines are outlined above.  Orders Placed This Encounter  Procedures   EKG 12-Lead   Medication changes: No orders of the defined types were placed in this encounter.   Signed, Park Liter, MD, [facility] 07/20/2022 11:49 AM    [place]

## 2022-08-31 ENCOUNTER — Other Ambulatory Visit: Payer: Self-pay | Admitting: Cardiology

## 2022-08-31 NOTE — Telephone Encounter (Signed)
Rx refill sent to pharmacy. 

## 2023-05-15 ENCOUNTER — Telehealth: Payer: Self-pay | Admitting: Cardiology

## 2023-05-15 MED ORDER — METOPROLOL TARTRATE 25 MG PO TABS: 12.5000 mg | ORAL_TABLET | Freq: Every day | ORAL | 1 refills | Status: DC

## 2023-05-15 NOTE — Telephone Encounter (Signed)
*  STAT* If patient is at the pharmacy, call can be transferred to refill team.   1. Which medications need to be refilled? (please list name of each medication and dose if known)   metoprolol tartrate (LOPRESSOR) 25 MG tablet    2. Which pharmacy/location (including street and city if local pharmacy) is medication to be sent to?  [facility] - [place], [place] - [address]      3. Do they need a 30 day or 90 day supply? 90 day

## 2023-07-31 ENCOUNTER — Ambulatory Visit: Payer: Medicare Other | Attending: Cardiology | Admitting: Cardiology

## 2023-07-31 ENCOUNTER — Encounter: Payer: Self-pay | Admitting: Cardiology

## 2023-07-31 VITALS — BP 107/72 | HR 101 | Ht 60.0 in | Wt 145.2 lb

## 2023-07-31 DIAGNOSIS — I10 Essential (primary) hypertension: Secondary | ICD-10-CM | POA: Insufficient documentation

## 2023-07-31 DIAGNOSIS — R002 Palpitations: Secondary | ICD-10-CM | POA: Diagnosis present

## 2023-07-31 DIAGNOSIS — I471 Supraventricular tachycardia, unspecified: Secondary | ICD-10-CM | POA: Diagnosis present

## 2023-07-31 NOTE — Patient Instructions (Signed)

## 2023-07-31 NOTE — Progress Notes (Signed)
Cardiology Office Note:    Date:  07/31/2023   ID:  Jenna Poole, DOB 1941-10-07, MRN [id number]  PCP:  Gordan Payment., MD  Cardiologist:  Gypsy Balsam, MD    Referring MD: Gordan Payment., MD   Chief Complaint  Patient presents with   Follow-up    History of Present Illness:    Jenna Poole is a 82 y.o. female past medical history significant for palpitations, monitor shows some atrial tachycardia versus supraventricular tachycardia initially put on 50 mg of metoprolol could not tolerate it because of bradycardia try Cardizem did not work out well now she is taking very small dose of beta-blocker she takes 12.5 daily of metoprolol titrate seems to be keeping her arrhythmia under control.  Comes today to months for follow-up.  Doing very well.  The biggest problem she has a problem at her back which is chronic issue denies have any chest pain tightness squeezing pressure burning chest  Past Medical History:  Diagnosis Date   Anal fissure    Anxiety    Arthritis    Cataract    GERD (gastroesophageal reflux disease)    HLD (hyperlipidemia)    Hypertension    Insomnia    Interstitial cystitis     Past Surgical History:  Procedure Laterality Date   ABDOMINAL HYSTERECTOMY  1988   APPENDECTOMY  1947   BREAST SURGERY Right    benign cyst removed   ESOPHAGOGASTRODUODENOSCOPY     LUMBAR LAMINECTOMY/DECOMPRESSION MICRODISCECTOMY Left 04/19/2017   Procedure: Left Lumbar Two-Three decompressive hemilaminectomy;  Surgeon: Tia Alert, MD;  Location: [facility] OR;  Service: Neurosurgery;  Laterality: Left;    Current Medications: Current Meds  Medication Sig   acetaminophen (TYLENOL) 500 MG tablet Take 500 mg by mouth as needed for mild pain or moderate pain (for pain.).   ALPRAZolam (XANAX) 0.5 MG tablet Take 0.25 mg by mouth every 8 (eight) hours as needed for anxiety.    aspirin 81 MG chewable tablet Chew 81 mg by mouth daily.   atorvastatin (LIPITOR) 10 MG tablet Take 10  mg by mouth at bedtime.   atorvastatin (LIPITOR) 10 MG tablet Take 1 tablet by mouth daily.   fluticasone (FLONASE) 50 MCG/ACT nasal spray Place 1-2 sprays into both nostrils daily as needed for allergies.   gabapentin (NEURONTIN) 100 MG capsule Take 100 mg by mouth as needed for pain.   hydrochlorothiazide (MICROZIDE) 12.5 MG capsule Take 12.5 mg by mouth daily.   losartan (COZAAR) 50 MG tablet Take 1 tablet by mouth daily.   metoprolol tartrate (LOPRESSOR) 25 MG tablet Take 0.5 tablets (12.5 mg total) by mouth daily.   omeprazole (PRILOSEC) 40 MG capsule Take 1 capsule (40 mg total) by mouth 2 (two) times daily. **PLEASE CONTACT OFFICE TO SCHEDULE FOLLOW UP   PARoxetine (PAXIL) 10 MG tablet Take 1 tablet by mouth daily.   Polyvinyl Alcohol-Povidone (REFRESH OP) Place 1-2 drops into both eyes as needed (for dry eyes/allergy eyes.).    zolpidem (AMBIEN) 10 MG tablet Take 5-10 mg by mouth at bedtime as needed for sleep.    Current Facility-Administered Medications for the 07/31/23 encounter (Office Visit) with Georgeanna Lea, MD  Medication   0.9 %  sodium chloride infusion     Allergies:   Cefazolin   Social History   Socioeconomic History   Marital status: Married    Spouse name: Not on file   Number of children: 1   Years of education: Not on  file   Highest education level: Not on file  Occupational History   Occupation: retired Teacher, early years/pre  Tobacco Use   Smoking status: Former    Current packs/day: 0.00    Average packs/day: 0.5 packs/day for 10.0 years (5.0 ttl pk-yrs)    Types: Cigarettes    Start date: 12/11/1957    Quit date: 12/12/1967    Years since quitting: 55.6   Smokeless tobacco: Never  Vaping Use   Vaping status: Never Used  Substance and Sexual Activity   Alcohol use: Not Currently    Alcohol/week: 1.0 standard drink of alcohol    Types: 1 Glasses of wine per week    Comment: ocass wine   Drug use: No   Sexual activity: Not on file  Other Topics Concern    Not on file  Social History Narrative   Not on file   Social Determinants of Health   Financial Resource Strain: Not on file  Food Insecurity: Not on file  Transportation Needs: Not on file  Physical Activity: Not on file  Stress: Not on file  Social Connections: Not on file     Family History: The patient's family history includes Colon cancer in her paternal grandfather; Colon polyps in her father; Heart disease in her mother. There is no history of Rectal cancer, Stomach cancer, or Esophageal cancer. ROS:   Please see the history of present illness.    All 14 point review of systems negative except as described per history of present illness  EKGs/Labs/Other Studies Reviewed:    EKG Interpretation Date/Time:  Tuesday July 31 2023 15:35:06 EDT Ventricular Rate:  101 PR Interval:  130 QRS Duration:  66 QT Interval:  350 QTC Calculation: 453 R Axis:   24  Text Interpretation: Sinus tachycardia Otherwise normal ECG When compared with ECG of 10-Jun-2019 08:51, Vent. rate has increased BY  37 BPM Confirmed by Gypsy Balsam [phone]) on 07/31/2023 3:42:28 PM    Recent Labs: No results found for requested labs within last 365 days.  Recent Lipid Panel No results found for: "CHOL", "TRIG", "HDL", "CHOLHDL", "VLDL", "LDLCALC", "LDLDIRECT"  Physical Exam:    VS:  BP 107/72 (BP Location: Left Arm, Patient Position: Sitting)   Pulse (!) 101   Ht 5' (1.524 m)   Wt 145 lb 3.2 oz (65.9 kg)   SpO2 97%   BMI 28.36 kg/m     Wt Readings from Last 3 Encounters:  07/31/23 145 lb 3.2 oz (65.9 kg)  07/20/22 136 lb 9.6 oz (62 kg)  10/14/21 136 lb (61.7 kg)     GEN:  Well nourished, well developed in no acute distress HEENT: Normal NECK: No JVD; No carotid bruits LYMPHATICS: No lymphadenopathy CARDIAC: RRR, no murmurs, no rubs, no gallops RESPIRATORY:  Clear to auscultation without rales, wheezing or rhonchi  ABDOMEN: Soft, non-tender, non-distended MUSCULOSKELETAL:  No  edema; No deformity  SKIN: Warm and dry LOWER EXTREMITIES: no swelling NEUROLOGIC:  Alert and oriented x 3 PSYCHIATRIC:  Normal affect   ASSESSMENT:    1. Paroxysmal SVT (supraventricular tachycardia)   2. Essential hypertension   3. Palpitations    PLAN:    In order of problems listed above:  Supraventricular tachycardia stable on appropriate medication continue. Essential hypertension blood pressure well-controlled continue present management. Palpitations denies having any   Medication Adjustments/Labs and Tests Ordered: Current medicines are reviewed at length with the patient today.  Concerns regarding medicines are outlined above.  Orders Placed This Encounter  Procedures  EKG 12-Lead   Medication changes: No orders of the defined types were placed in this encounter.   Signed, Georgeanna Lea, MD, [facility] 07/31/2023 3:49 PM    [facility]

## 2023-10-31 ENCOUNTER — Other Ambulatory Visit: Payer: Self-pay | Admitting: Cardiology

## 2024-01-28 ENCOUNTER — Other Ambulatory Visit: Payer: Self-pay | Admitting: Cardiology

## 2024-01-28 NOTE — Telephone Encounter (Signed)
 Rx refill sent to pharmacy.

## 2024-03-04 ENCOUNTER — Encounter: Payer: Self-pay | Admitting: Cardiology

## 2024-03-13 ENCOUNTER — Ambulatory Visit: Admitting: Cardiology

## 2024-03-21 ENCOUNTER — Ambulatory Visit: Attending: Cardiology | Admitting: Cardiology

## 2024-03-21 ENCOUNTER — Encounter: Payer: Self-pay | Admitting: Cardiology

## 2024-03-21 VITALS — BP 122/68 | HR 62 | Ht 61.0 in | Wt 143.0 lb

## 2024-03-21 DIAGNOSIS — R0600 Dyspnea, unspecified: Secondary | ICD-10-CM

## 2024-03-21 DIAGNOSIS — R002 Palpitations: Secondary | ICD-10-CM | POA: Diagnosis present

## 2024-03-21 DIAGNOSIS — I10 Essential (primary) hypertension: Secondary | ICD-10-CM | POA: Diagnosis present

## 2024-03-21 DIAGNOSIS — I471 Supraventricular tachycardia, unspecified: Secondary | ICD-10-CM

## 2024-03-21 NOTE — Progress Notes (Signed)
 Cardiology Office Note:  .   Date:  03/21/2024  ID:  Jenna Poole, DOB Dec 20, 1940, MRN [id number] PCP: Krystal Clark, NP  [facility] Providers Cardiologist:  Gypsy Balsam, MD    History of Present Illness: .   Jenna Poole is a 83 y.o. female with a past medical history of hypertension, SVT, GERD, CKD stage III, dyslipidemia.  09/06/2021 Lexiscan normal, low risk 07/03/2019 monitor average heart rate 65 bpm, multiple episodes of SVT >> started on metoprolol 06/12/2019 echo EF 60 to 65%, impaired relaxation, mild calcification of the mitral valve leaflet, mild sclerosis of the aortic valve 06/10/2019 Lexiscan normal, low risk  Most recently she was evaluated by Dr. Bing Matter on 07/31/2023, she had been unable to tolerate her metoprolol and been switched to Cardizem, this was also not working out so she was switched back to a low-dose beta-blocker and that seems to be working for her.  No other changes were made to her plan of care she was advised to follow-up in 1 year.  She presents today with concerns of shortness of breath.  Her symptoms are hard to decipher her, appear to only last for seconds, can occur with rest or exertion.  She states the symptoms have been occurring for a few months.  She is a retired Teacher, early years/pre, fully retired about 4 years ago.  She also states she is relatively sedentary secondary to back pain and previous back surgery. She denies chest pain, palpitations, dyspnea, pnd, orthopnea, n, v, dizziness, syncope, edema, weight gain, or early satiety.   ROS: Review of Systems  Respiratory:  Positive for shortness of breath.   Musculoskeletal:  Positive for back pain.  All other systems reviewed and are negative.    Studies Reviewed: .        Cardiac Studies & Procedures   ______________________________________________________________________________________________   STRESS TESTS  MYOCARDIAL PERFUSION IMAGING 09/06/2021  Narrative   The  study is normal. The study is low risk.   No ST deviation was noted.   Left ventricular function is normal. Nuclear stress EF: 81 %. The left ventricular ejection fraction is hyperdynamic (>65%). End diastolic cavity size is normal.   Prior study available for comparison from 06/10/2019.   ECHOCARDIOGRAM  ECHOCARDIOGRAM COMPLETE 06/12/2019  Narrative ECHOCARDIOGRAM REPORT    Patient Name:   ELIANIE Poole Date of Exam: 06/12/2019 Medical Rec #:  [id number]       Height:       59.0 in Accession #:    [id number]      Weight:       147.0 lb Date of Birth:  November 10, 1941        BSA:          1.62 m Patient Age:    78 years        BP:           136/68 mmHg Patient Gender: F               HR:           63 bpm. Exam Location:  [place]   Procedure: 2D Echo  Indications:    CP  History:        Patient has no prior history of Echocardiogram examinations.  Sonographer:    Sinda Du RDCS (AE) Referring Phys: [phone] Marveen Reeks KRASOWSKI  IMPRESSIONS   1. The left ventricle has normal systolic function with an ejection fraction of 60-65%. The cavity size was normal. Left ventricular  diastolic Doppler parameters are consistent with impaired relaxation. 2. The right ventricle has normal systolic function. The cavity was normal. There is no increase in right ventricular wall thickness. 3. The mitral valve is grossly normal. Mild calcification of the mitral valve leaflet. 4. The aortic valve is grossly normal. Mild sclerosis of the aortic valve. Aortic valve regurgitation was not assessed by color flow Doppler.  FINDINGS Left Ventricle: The left ventricle has normal systolic function, with an ejection fraction of 60-65%. The cavity size was normal. There is no increase in left ventricular wall thickness. Left ventricular diastolic Doppler parameters are consistent with impaired relaxation.  Right Ventricle: The right ventricle has normal systolic function. The cavity was normal. There is no  increase in right ventricular wall thickness.  Left Atrium: Left atrial size was normal in size.  Right Atrium: Right atrial size was normal in size. Right atrial pressure is estimated at 3 mmHg.  Interatrial Septum: No atrial level shunt detected by color flow Doppler.  Pericardium: There is no evidence of pericardial effusion.  Mitral Valve: The mitral valve is grossly normal. Mild calcification of the mitral valve leaflet. Mitral valve regurgitation was not assessed by color flow Doppler.  Tricuspid Valve: The tricuspid valve is normal in structure. Tricuspid valve regurgitation is trivial by color flow Doppler.  Aortic Valve: The aortic valve is grossly normal Mild sclerosis of the aortic valve. Aortic valve regurgitation was not assessed by color flow Doppler.  Pulmonic Valve: The pulmonic valve was not well visualized. Pulmonic valve regurgitation was not assessed by color flow Doppler.  Venous: The inferior vena cava measures 1.30 cm, is normal in size with greater than 50% respiratory variability.   +--------------+--------++ LEFT VENTRICLE         +----------------+---------++ +--------------+--------++ Diastology                PLAX 2D                +----------------+---------++ +--------------+--------++ LV e' lateral:  8.27 cm/s LVIDd:        3.90 cm  +----------------+---------++ +--------------+--------++ LV E/e' lateral:10.2      LVIDs:        2.30 cm  +----------------+---------++ +--------------+--------++ LV e' medial:   9.48 cm/s LV PW:        1.00 cm  +----------------+---------++ +--------------+--------++ LV E/e' medial: 8.9       LV IVS:       1.10 cm  +----------------+---------++ +--------------+--------++ LVOT diam:    1.70 cm  +--------------+--------++ LV SV:        48 ml    +--------------+--------++ LV SV Index:  28.24    +--------------+--------++ LVOT Area:    2.27  cm +--------------+--------++                        +--------------+--------++  +---------------+----------++ RIGHT VENTRICLE           +---------------+----------++ RV Basal diam: 2.90 cm    +---------------+----------++ RV S prime:    12.10 cm/s +---------------+----------++ TAPSE (M-mode):2.3 cm     +---------------+----------++  +---------------+-------++-----------++ LEFT ATRIUM           Index       +---------------+-------++-----------++ LA diam:       2.90 cm1.79 cm/m  +---------------+-------++-----------++ LA Vol (A2C):  39.4 ml24.35 ml/m +---------------+-------++-----------++ LA Vol (A4C):  32.6 ml20.15 ml/m +---------------+-------++-----------++ LA Biplane Vol:36.9 ml22.80 ml/m +---------------+-------++-----------++ +------------+---------++-----------++ RIGHT ATRIUM         Index       +------------+---------++-----------++  RA Area:    13.30 cm            +------------+---------++-----------++ RA Volume:  30.10 ml 18.60 ml/m +------------+---------++-----------++ +---------------+-----------++ AORTIC VALVE               +---------------+-----------++ AV Area (Vmax):1.90 cm    +---------------+-----------++ AV Vmax:       140.00 cm/s +---------------+-----------++ AV Peak Grad:  7.8 mmHg    +---------------+-----------++ LVOT Vmax:     117.00 cm/s +---------------+-----------++ LVOT Vmean:    75.800 cm/s +---------------+-----------++ LVOT VTI:      0.270 m     +---------------+-----------++  +-------------+-------++ AORTA                +-------------+-------++ Ao Root diam:2.50 cm +-------------+-------++ Ao Asc diam: 3.10 cm +-------------+-------++  +--------------+----------++  +---------------+-----------++ MITRAL VALVE              TRICUSPID VALVE            +--------------+----------++   +---------------+-----------++ MV Area (PHT):2.69 cm    TR Peak grad:  19.5 mmHg   +--------------+----------++  +---------------+-----------++ MV PHT:       81.78 msec  TR Vmax:       228.00 cm/s +--------------+----------++  +---------------+-----------++ MV Decel Time:282 msec   +--------------+----------++  +--------------+-------+ +--------------+-----------++ SHUNTS                MV E velocity:84.40 cm/s  +--------------+-------+ +--------------+-----------++ Systemic VTI: 0.27 m  MV A velocity:110.00 cm/s +--------------+-------+ +--------------+-----------++ Systemic Diam:1.70 cm MV E/A ratio: 0.77        +--------------+-------+ +--------------+-----------++  +---------+-------+ IVC              +---------+-------+ IVC diam:1.30 cm +---------+-------+   Belva Crome MD Electronically signed by Belva Crome MD Signature Date/Time: 06/12/2019/12:06:32 PM    Final    MONITORS  LONG TERM MONITOR (3-14 DAYS) 07/18/2019  Narrative Babs Sciara Moren, DOB [birth year], MRN [id number]  HOLTER MONITOR REPORT:    Date of test:                 07/02/2019 Duration of test:           4 days Indication:                    Palpitations Ordering physician:  Georgeanna Lea, MD Referring physician:  Georgeanna Lea, MD   Baseline rhythm: Sinus  Minimum heart rate: 46 BPM.  Average heart rate: 65 BPM.  Maximal heart rate 218 BPM.  Atrial arrhythmia: Multiple episodes nonsustained supraventricular tachycardias.  At long RP tachycardia most likely atrial tachycardia.  Longest episode 3 minutes 9 seconds at rate of 131, fastest episode 12 beats at rate of 218  Ventricular arrhythmia: Infrequent premature ventricular beats  Conduction abnormality: None  Symptoms: None   Conclusion: Multiple runs of supraventricular tachycardia most likely atrial tachycardia  Interpreting  cardiologist: Gypsy Balsam, MD Date:  07/20/2019 9:24 AM       ______________________________________________________________________________________________      Risk Assessment/Calculations:             Physical Exam:   VS:  BP 122/68   Pulse 62   Ht 5\' 1"  (1.549 m)   Wt 143 lb (64.9 kg)   SpO2 95%   BMI 27.02 kg/m    Wt Readings from Last 3 Encounters:  03/21/24 143 lb (64.9 kg)  02/28/24 143 lb (64.9 kg)  07/31/23 145 lb 3.2 oz (65.9 kg)  GEN: Appears younger than stated age, well nourished, well developed in no acute distress NECK: No JVD; No carotid bruits CARDIAC: RRR, no murmurs, rubs, gallops RESPIRATORY: Velcro-like crackles at the base of her lungs ABDOMEN: Soft, non-tender, non-distended EXTREMITIES:  No edema; No deformity   ASSESSMENT AND PLAN: .   Shortness of breath/DOE-is the purpose of her visit today.  Her symptoms have been bothering her for a few months, hard for her to describe but overall her symptoms are brief in nature, and not accompanied by any other symptoms.  Will repeat echocardiogram for any contributory causes.  Lung sounds are clear however they do reveal Velcro-like crackles. She is not on any nebulizers and if her echo is normal, then she should follow up with her PCP to see if she needs to establish with a pulmonologist.   Palpitations/SVT -currently quiescent.  Continue metoprolol 25 mg daily.  Hypertension-blood pressure is controlled 122/68, continue HCTZ 12.5 mg daily, continue Cozaar 50 mg daily, continue metoprolol 25 mg daily.       Dispo: Echocardiogram for DOE.  Follow-up in 1 year with general cardiology.  Signed, Flossie Dibble, NP

## 2024-03-21 NOTE — Patient Instructions (Signed)
 Medication Instructions:  Your physician recommends that you continue on your current medications as directed. Please refer to the Current Medication list given to you today.  *If you need a refill on your cardiac medications before your next appointment, please call your pharmacy*  Lab Work: NONE If you have labs (blood work) drawn today and your tests are completely normal, you will receive your results only by: MyChart Message (if you have MyChart) OR A paper copy in the mail If you have any lab test that is abnormal or we need to change your treatment, we will call you to review the results.  Testing/Procedures: Your physician has requested that you have an echocardiogram. Echocardiography is a painless test that uses sound waves to create images of your heart. It provides your doctor with information about the size and shape of your heart and how well your heart's chambers and valves are working. This procedure takes approximately one hour. There are no restrictions for this procedure. Please do NOT wear cologne, perfume, aftershave, or lotions (deodorant is allowed). Please arrive 15 minutes prior to your appointment time.  Please note: We ask at that you not bring children with you during ultrasound (echo/ vascular) testing. Due to room size and safety concerns, children are not allowed in the ultrasound rooms during exams. Our front office staff cannot provide observation of children in our lobby area while testing is being conducted. An adult accompanying a patient to their appointment will only be allowed in the ultrasound room at the discretion of the ultrasound technician under special circumstances. We apologize for any inconvenience.   Follow-Up: At [facility], you and your health needs are our priority.  As part of our continuing mission to provide you with exceptional heart care, our providers are all part of one team.  This team includes your primary Cardiologist  (physician) and Advanced Practice Providers or APPs (Physician Assistants and Nurse Practitioners) who all work together to provide you with the care you need, when you need it.  Your next appointment:   1 year(s)  Provider:   Gypsy Balsam, MD    We recommend signing up for the patient portal called "MyChart".  Sign up information is provided on this After Visit Summary.  MyChart is used to connect with patients for Virtual Visits (Telemedicine).  Patients are able to view lab/test results, encounter notes, upcoming appointments, etc.  Non-urgent messages can be sent to your provider as well.   To learn more about what you can do with MyChart, go to [email or web address].   Other Instructions

## 2024-04-18 ENCOUNTER — Ambulatory Visit: Attending: Cardiology

## 2024-04-18 DIAGNOSIS — R0600 Dyspnea, unspecified: Secondary | ICD-10-CM | POA: Insufficient documentation

## 2024-04-18 LAB — ECHOCARDIOGRAM COMPLETE
AR max vel: 1.72 cm2
AV Area VTI: 1.8 cm2
AV Area mean vel: 1.61 cm2
AV Mean grad: 4.2 mmHg
AV Peak grad: 8.3 mmHg
Ao pk vel: 1.44 m/s
Area-P 1/2: 4.21 cm2
MV M vel: 3.24 m/s
MV Peak grad: 42 mmHg
S' Lateral: 2.2 cm

## 2024-04-22 ENCOUNTER — Ambulatory Visit: Payer: Self-pay | Admitting: Emergency Medicine

## 2024-04-22 NOTE — Telephone Encounter (Signed)
 Left voice mail

## 2024-04-22 NOTE — Telephone Encounter (Signed)
-----   Message from Terrance Ferretti sent at 04/21/2024  9:47 AM EDT ----- Echocardiogram reveals that her heart is squeezing normally, mild thickness around her left ventricle-rashes typically attributed to longstanding high blood pressure.  Slightly elevated pulmonary arterial pressures, this could be causing some of her shortness of breath.   Mild leaking around her mitral valve.  Mild thickening around her aortic valve.  How does her breathing feel for her?  She looked okay when I saw her in the hallway after her echocardiogram.  We could give her low-dose diuretic to see if it helps with her shortness of breath.  If she is interested in this, we could give her Lasix 20 mg to take as needed for shortness of breath.  I saw her lab work from her PCP back in March she has stable kidney dysfunction, so if she is interested I would want to repeat a BMET in 2 weeks.

## 2024-04-23 NOTE — Telephone Encounter (Signed)
 Called and spoke to patient.   Reviewed echo results and recommendations as per [facility] note. Patient reports that her breathing is better, and she is no longer short of breath. She denies any other symptoms. She declines wanting to start a low dose diuretic at this time. She reports that she will call our office if she has any problems. She verbalized understanding and had no further questions.

## 2024-06-23 ENCOUNTER — Ambulatory Visit (INDEPENDENT_AMBULATORY_CARE_PROVIDER_SITE_OTHER)
Admit: 2024-06-23 | Discharge: 2024-06-23 | Disposition: A | Discharge status: Home / Self Care | Attending: Family Medicine | Admitting: Family Medicine

## 2024-06-23 ENCOUNTER — Ambulatory Visit (HOSPITAL_BASED_OUTPATIENT_CLINIC_OR_DEPARTMENT_OTHER): Admission: EM | Admit: 2024-06-23 | Discharge: 2024-06-23 | Disposition: A | Discharge status: Home / Self Care

## 2024-06-23 ENCOUNTER — Encounter (HOSPITAL_BASED_OUTPATIENT_CLINIC_OR_DEPARTMENT_OTHER): Payer: Self-pay

## 2024-06-23 DIAGNOSIS — M19012 Primary osteoarthritis, left shoulder: Secondary | ICD-10-CM

## 2024-06-23 DIAGNOSIS — M25512 Pain in left shoulder: Secondary | ICD-10-CM

## 2024-06-23 NOTE — ED Triage Notes (Signed)
 Pt c/o left shoulder pain that started a week ago. Denies known injury but does sleep on her side. Pain occasionally radiates down her left arm. Pain is worst when she tries to rotate the shoulder. Has difficulty raising arm above her shoulder. She has taken tylenol  with some relief.

## 2024-06-23 NOTE — Discharge Instructions (Addendum)
 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 This is where the cartilage in your shoulder wears down. You need to see orthopedic for follow up. Call [facility] tomorrow to schedule unless you already have an orthopedic. You can take extra strength tylenol  for the pain.

## 2024-06-23 NOTE — ED Provider Notes (Signed)
 [facility]    CSN: [id number] Arrival date & time: 06/23/24  1539      History   Chief Complaint Chief Complaint  Patient presents with   Shoulder Pain    HPI Jenna Poole is a 83 y.o. female.   83 year old female that presents with  left shoulder pain that started a week ago. Denies known injury but does sleep on her side. Pain occasionally radiates down her left arm. Pain is worst when she tries to rotate the shoulder. Has difficulty raising arm above her shoulder. She has taken tylenol  with some relief. No numbness, tingling in the arm.    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/24/2016   Dyspnea on exertion 05/14/2019   Essential hypertension 02/24/2016   Last Assessment & Plan:   Relevant Hx:  Course:  Daily Update:  Today's Plan:this is stable for her see her CMP for her with her CKD to ensure stable get eyes updated annually     Electronically signed by: Eleanor Merlynn Lady, NP  02/28/16 1004     Gastro-esophageal reflux disease with esophagitis 02/24/2016   Last Assessment & Plan:   Relevant Hx:  Course:  Daily Update:  Today's Plan:difficult for her at times on PPI supplements with zantac and others OTC as well for this saw Dr Meriel about year ago and recommended for her to continue with meds and not update EGD at that time she will fu with him     Electronically signed by: Eleanor Merlynn Lady, NP  02/28/16 1005     GERD (gastroesophageal reflux disease)    High risk medication use 02/24/2016    HLD (hyperlipidemia)    Hyperglycemia    Hypertension    Insomnia    Interstitial cystitis    Malaise and fatigue    Mild intermittent asthma without complication    Mixed hyperlipidemia 02/24/2016   Last Assessment & Plan:   Update her lipids for her today and will see how she is doing on her med     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/24/2016   Osteopenia 02/24/2016   Osteopenia, unspecified location    Palpitations 05/14/2019   Paroxysmal SVT (supraventricular tachycardia) (HCC)    Precordial chest pain 05/14/2019   Primary 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/24/2016   Last Assessment & Plan:   She uses the ambien  which she is compliant with and feels helps her     Primary osteoarthritis involving multiple joints    S/P lumbar laminectomy 04/19/2017   Stage 3b chronic kidney disease (CKD) (HCC)    Vitamin D deficiency     Patient Active Problem List   Diagnosis Date Noted   Vitamin D deficiency 10/11/2021   Interstitial cystitis 08/31/2021   GERD (gastroesophageal reflux disease) 08/31/2021   Cataract 08/31/2021   Arthritis 08/31/2021   Anal fissure 08/31/2021   Paroxysmal SVT (supraventricular tachycardia) (HCC) 08/19/2019   Palpitations 05/14/2019  Dyspnea on exertion 05/14/2019   Precordial chest pain 05/14/2019   Hyperglycemia 09/25/2017   S/P lumbar laminectomy 04/19/2017   Mild intermittent asthma without complication 02/28/2016   Anxiety disorder 02/24/2016   CKD (chronic kidney disease), stage III (HCC) 02/24/2016   Essential hypertension 02/24/2016   Gastro-esophageal reflux disease with esophagitis 02/24/2016   Mixed hyperlipidemia 02/24/2016   Primary insomnia 02/24/2016   Osteopenia 02/24/2016   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/24/2016   Malaise and fatigue 02/24/2016   High risk medication use 02/24/2016   Degeneration of lumbar intervertebral disc 02/24/2016    Past Surgical History:  Procedure Laterality Date   ABDOMINAL HYSTERECTOMY  1988   APPENDECTOMY  1947    BREAST SURGERY Right    benign cyst removed   ESOPHAGOGASTRODUODENOSCOPY     LUMBAR LAMINECTOMY/DECOMPRESSION MICRODISCECTOMY Left 04/19/2017   Procedure: Left Lumbar Two-Three decompressive hemilaminectomy;  Surgeon: Joshua Alm RAMAN, MD;  Location: [facility] OR;  Service: Neurosurgery;  Laterality: Left;    OB History   No obstetric history on file.      Home Medications    Prior to Admission medications   Medication Sig Start Date End Date Taking? Authorizing Provider  metoprolol  tartrate (LOPRESSOR ) 25 MG tablet Take 12.5 mg by mouth daily. 04/22/24  Yes [provider]  acetaminophen  (TYLENOL ) 500 MG tablet Take 500 mg by mouth as needed for mild pain or moderate pain (for pain.).    [provider]  ALPRAZolam  (XANAX ) 0.5 MG tablet Take 0.5 mg by mouth as needed for anxiety or sleep. 08/22/23   [provider]  aspirin 81 MG chewable tablet Chew 81 mg by mouth daily.    [provider]  atorvastatin (LIPITOR) 10 MG tablet Take 10 mg by mouth at bedtime.    [provider]  Cholecalciferol (VITAMIN D3) 50 MCG (2000 UT) capsule Take 2,000 Units by mouth daily.    [provider]  dicyclomine (BENTYL) 10 MG capsule Take 10 mg by mouth as needed for spasms.    [provider]  fluticasone  (FLONASE ) 50 MCG/ACT nasal spray Place 1-2 sprays into both nostrils daily as needed for allergies.    [provider]  hydrochlorothiazide  (MICROZIDE ) 12.5 MG capsule Take 12.5 mg by mouth daily. 05/17/21   [provider]  HYDROcodone -acetaminophen  (NORCO/VICODIN) 5-325 MG tablet Take 1 tablet by mouth daily. 02/18/24   [provider]  losartan  (COZAAR ) 50 MG tablet Take 1 tablet by mouth daily. 05/17/21   [provider]  metroNIDAZOLE (METROGEL) 0.75 % gel Apply 1 Application topically 2 (two) times daily.    [provider]  omeprazole  (PRILOSEC) 40 MG capsule Take 40 mg by mouth every morning.     [provider]  PARoxetine  (PAXIL ) 10 MG tablet Take 1 tablet by mouth daily. 05/19/21   [provider]  Polyvinyl Alcohol-Povidone (REFRESH OP) Place 1-2 drops into both eyes as needed (for dry eyes/allergy eyes.).     [provider]  zolpidem  (AMBIEN ) 10 MG tablet Take 10 mg by mouth at bedtime as needed for sleep. 01/03/24   [provider]    Family History Family History  Problem Relation Age of Onset   Heart disease Mother        a-fib   Colon polyps Father    COPD Father    Diabetes Maternal Grandmother    Colon cancer Paternal Grandfather    Rectal cancer Neg Hx    Stomach cancer Neg Hx  Esophageal cancer Neg Hx     Social History Social History   Tobacco Use   Smoking status: Former    Current packs/day: 0.00    Average packs/day: 0.5 packs/day for 10.0 years (5.0 ttl pk-yrs)    Types: Cigarettes    Start date: 12/11/1957    Quit date: 12/12/1967    Years since quitting: 56.5   Smokeless tobacco: Never  Vaping Use   Vaping status: Never Used  Substance Use Topics   Alcohol use: Yes    Alcohol/week: 1.0 standard drink of alcohol    Types: 1 Glasses of wine per week    Comment: ocass wine   Drug use: No     Allergies   Cefazolin    Review of Systems Review of Systems See HPI  Physical Exam Triage Vital Signs ED Triage Vitals  Encounter Vitals Group     BP 06/23/24 1555 128/78     Girls Systolic BP Percentile --      Girls Diastolic BP Percentile --      Boys Systolic BP Percentile --      Boys Diastolic BP Percentile --      Pulse Rate 06/23/24 1555 99     Resp 06/23/24 1555 20     Temp 06/23/24 1555 97.6 F (36.4 C)     Temp Source 06/23/24 1555 Oral     SpO2 06/23/24 1555 97 %     Weight --      Height --      Head Circumference --      Peak Flow --      Pain Score 06/23/24 1551 8     Pain Loc --      Pain Education --      Exclude from Growth Chart --    No data found.  Updated Vital Signs BP  128/78 (BP Location: Right Arm)   Pulse 99   Temp 97.6 F (36.4 C) (Oral)   Resp 20   SpO2 97%   Visual Acuity Right Eye Distance:   Left Eye Distance:   Bilateral Distance:    Right Eye Near:   Left Eye Near:    Bilateral Near:     Physical Exam Vitals and nursing note reviewed.  Constitutional:      General: She is not in acute distress.    Appearance: Normal appearance. She is not ill-appearing, toxic-appearing or diaphoretic.  Pulmonary:     Effort: Pulmonary effort is normal.  Musculoskeletal:     Left shoulder: Tenderness present. Decreased range of motion.  Neurological:     Mental Status: She is alert.  Psychiatric:        Mood and Affect: Mood normal.      UC Treatments / Results  Labs (all labs ordered are listed, but only abnormal results are displayed) Labs Reviewed - No data to display  EKG   Radiology DG Shoulder Left Result Date: 06/23/2024 CLINICAL DATA:  Shoulder pain EXAM: LEFT SHOULDER - 2+ VIEW COMPARISON:  None Available. FINDINGS: There is severe glenohumeral joint space narrowing with osteophyte formation. There is no acute fracture or dislocation identified. Soft tissues are within normal limits. IMPRESSION: Severe glenohumeral degenerative changes. Electronically Signed   By: Greig Pique M.D.   On: 06/23/2024 16:31    Procedures Procedures (including critical care time)  Medications Ordered in UC Medications - No data to display  Initial Impression / Assessment and Plan / UC Course  I have reviewed the triage vital signs  and the nursing notes.  Pertinent labs & imaging results that were available during my care of the patient were reviewed by me and considered in my medical decision making (see chart for details).     OA of the left shoulder- x ray revealed severe glenohumeral degenerative changes. Results explained to pt.  Recommendations are to see Ortho for follow up and further management of this.  She can take tylenol  for  pain as needed.  Final Clinical Impressions(s) / UC Diagnoses   Final diagnoses:  Pain in joint of left shoulder  Primary osteoarthritis of left shoulder     Discharge Instructions      Your x ray revealed severe joint degenerative changes. This is where the cartilage in your shoulder wears down. You need to see orthopedic for follow up. Call [facility] tomorrow to schedule unless you already have an orthopedic. You can take extra strength tylenol  for the pain.     ED Prescriptions   None    PDMP not reviewed this encounter.   Adah Wilbert LABOR, FNP 06/23/24 1715

## 2024-07-16 ENCOUNTER — Other Ambulatory Visit: Payer: Self-pay | Admitting: Cardiology

## 2024-09-16 NOTE — Progress Notes (Signed)
 Cardiology Office Note:  .   Date:  09/17/2024  ID:  Jenna Poole, DOB 1941-12-11, MRN [id number] PCP: Benson Eleanor Rung, NP  [facility] Providers Cardiologist:  Lamar Fitch, MD    History of Present Illness: .   Jenna Poole is a 83 y.o. female with a past medical history of hypertension, SVT, GERD, CKD stage III, dyslipidemia.  04/18/2024 Echo EF 60-65%, mild LVH, mildly elevated PASP 42.9 mmHg, mild MR 09/06/2021 Lexiscan  normal, low risk 07/03/2019 monitor average heart rate 65 bpm, multiple episodes of SVT >> started on metoprolol  06/12/2019 echo EF 60 to 65%, impaired relaxation, mild calcification of the mitral valve leaflet, mild sclerosis of the aortic valve 06/10/2019 Lexiscan  normal, low risk  She established care with Dr. Krasowski in 2020 for evaluation of her hypertension. She had a lexiscan  in 2020 which was a normal, low risk study. She wore a monitor in 2020 revealing episodes of SVT. Evaluated by Dr. Fitch on 07/31/2023, she had been unable to tolerate her metoprolol  and been switched to Cardizem , this was also not working out so she was switched back to a low-dose beta-blocker and that seems to be working for her.  No other changes were made to her plan of care she was advised to follow-up in 1 year.  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  We repeated echocardiogram, was overall unchanged, mildly elevated PASP.  She presents today for follow up after a recent OV with her PCP, currently when she first arrived to the office her heart rate was checked via pulse ox was elevated at 112 however they checked a EKG revealing normal sinus rhythm, heart rate in the 60s.  There was some concern on whether she should wear a monitor or not so she followed up today  to see if this was warranted.  She states she was completely unaware that her heart rate was elevated on that day, she denies episodes of palpitation, she has also been checking her pulse ox at home and reveals her heart rate is typically in the 60s.  Did discuss we could wear a monitor however she did wear 1 in 2020 revealing episodes of SVT which is why we started her on metoprolol , also likely what she was experiencing when she presented to her PCP office and she does not really want to wear a monitor if she does not have to.  She is still bothered by episodes of shortness of breath however she feels it is at baseline and no worse or better. She denies chest pain, palpitations, pnd, orthopnea, n, v, dizziness, syncope, edema, weight gain, or early satiety.   ROS: Review of Systems  Respiratory:  Positive for shortness of breath.   Musculoskeletal:  Positive for back pain.  All other systems reviewed and are negative.    Studies Reviewed: SABRA   EKG Interpretation Date/Time:  Wednesday September 17 2024 11:05:33 EDT Ventricular Rate:  63 PR Interval:  174 QRS Duration:  66 QT Interval:  384 QTC Calculation: 392 R Axis:   -15  Text Interpretation: Normal sinus rhythm Normal ECG When compared with ECG of 31-Jul-2023 15:35, Vent. rate has decreased BY  38 BPM QT has shortened Confirmed by Carlin Nest [phone]) on 09/17/2024 11:21:57 AM    Cardiac Studies & Procedures  ______________________________________________________________________________________________   STRESS TESTS  MYOCARDIAL PERFUSION IMAGING 09/06/2021  Interpretation Summary   The study is normal. The study is low risk.   No ST deviation was noted.   Left ventricular function is normal. Nuclear stress EF: 81 %. The left ventricular ejection fraction is hyperdynamic (>65%). End diastolic cavity size is normal.   Prior study available for comparison from 06/10/2019.   ECHOCARDIOGRAM  ECHOCARDIOGRAM COMPLETE  04/18/2024  Narrative ECHOCARDIOGRAM REPORT    Patient Name:   Jenna Poole Date of Exam: 04/18/2024 Medical Rec #:  [id number]       Height:       61.0 in Accession #:    [id number]      Weight:       143.0 lb Date of Birth:  1941/01/17        BSA:          1.638 m Patient Age:    83 years        BP:           122/68 mmHg Patient Gender: F               HR:           58 bpm. Exam Location:  [place]  Procedure: 2D Echo, Cardiac Doppler, Color Doppler and Strain Analysis (Both Spectral and Color Flow Doppler were utilized during procedure).  Indications:    Dyspnea, unspecified type [R06.00 (ICD-10-CM)]  History:        Patient has prior history of Echocardiogram examinations, most recent 06/12/2019. Arrythmias:SVT, Signs/Symptoms:Dyspnea; Risk Factors:Hypertension and Dyslipidemia.  Sonographer:    Charlie Jointer RDCS Referring Phys: [phone] Ermalinda Joubert C Haruna Rohlfs  IMPRESSIONS   1. Left ventricular ejection fraction, by estimation, is 60 to 65%. The left ventricle has normal function. The left ventricle has no regional wall motion abnormalities. There is mild left ventricular hypertrophy. Left ventricular diastolic parameters were normal. The average left ventricular global longitudinal strain is -16.5 %. 2. Right ventricular systolic function is normal. The right ventricular size is normal. There is mildly elevated pulmonary artery systolic pressure. The estimated right ventricular systolic pressure is 42.9 mmHg. 3. The mitral valve is degenerative. Mild mitral valve regurgitation. No evidence of mitral stenosis. 4. The aortic valve is tricuspid. There is mild thickening of the aortic valve. Aortic valve regurgitation is trivial. No aortic stenosis is present. 5. The inferior vena cava is normal in size with greater than 50% respiratory variability, suggesting right atrial pressure of 3 mmHg.  Comparison(s): Last TTE report from 06/12/2019 reported LVEF 60-65%.  FINDINGS Left Ventricle:  Left ventricular ejection fraction, by estimation, is 60 to 65%. The left ventricle has normal function. The left ventricle has no regional wall motion abnormalities. The average left ventricular global longitudinal strain is -16.5 %. The left ventricular internal cavity size was normal in size. There is mild left ventricular hypertrophy. Left ventricular diastolic parameters were normal.  Right Ventricle: The right ventricular size is normal. Right vetricular wall thickness was not well visualized. Right ventricular systolic function is normal. There is mildly elevated pulmonary artery systolic pressure. The tricuspid regurgitant velocity is 3.16 m/s, and with an assumed right atrial pressure of 3 mmHg, the estimated right ventricular systolic pressure is 42.9 mmHg.  Left Atrium: Left atrial size was normal in size.  Right Atrium: Right atrial size was normal in size.  Pericardium: There is no evidence of pericardial effusion.  Mitral Valve: The mitral valve is degenerative in appearance. Mild mitral  annular calcification. Mild mitral valve regurgitation. No evidence of mitral valve stenosis.  Tricuspid Valve: The tricuspid valve is grossly normal. Tricuspid valve regurgitation is mild . No evidence of tricuspid stenosis.  Aortic Valve: The aortic valve is tricuspid. There is mild thickening of the aortic valve. Aortic valve regurgitation is trivial. No aortic stenosis is present. Aortic valve mean gradient measures 4.2 mmHg. Aortic valve peak gradient measures 8.3 mmHg. Aortic valve area, by VTI measures 1.80 cm.  Pulmonic Valve: The pulmonic valve was not well visualized. Pulmonic valve regurgitation is not visualized. No evidence of pulmonic stenosis.  Aorta: The aortic root and ascending aorta are structurally normal, with no evidence of dilitation.  Venous: The inferior vena cava is normal in size with greater than 50% respiratory variability, suggesting right atrial pressure of 3  mmHg.  IAS/Shunts: The interatrial septum was not well visualized.   LEFT VENTRICLE PLAX 2D LVIDd:         3.90 cm   Diastology LVIDs:         2.20 cm   LV e' medial:    7.40 cm/s LV PW:         0.90 cm   LV E/e' medial:  13.5 LV IVS:        1.10 cm   LV e' lateral:   11.05 cm/s LVOT diam:     1.80 cm   LV E/e' lateral: 9.0 LV SV:         57 LV SV Index:   35        2D Longitudinal Strain LVOT Area:     2.54 cm  2D Strain GLS Avg:     -16.5 %   RIGHT VENTRICLE             IVC RV Basal diam:  2.90 cm     IVC diam: 1.60 cm RV Mid diam:    2.30 cm RV S prime:     11.75 cm/s TAPSE (M-mode): 2.1 cm  LEFT ATRIUM             Index        RIGHT ATRIUM           Index LA diam:        3.40 cm 2.08 cm/m   RA Area:     14.50 cm LA Vol (A2C):   49.0 ml 29.92 ml/m  RA Volume:   32.80 ml  20.03 ml/m LA Vol (A4C):   54.9 ml 33.52 ml/m LA Biplane Vol: 52.9 ml 32.30 ml/m AORTIC VALVE AV Area (Vmax):    1.72 cm AV Area (Vmean):   1.61 cm AV Area (VTI):     1.80 cm AV Vmax:           144.37 cm/s AV Vmean:          96.405 cm/s AV VTI:            0.316 m AV Peak Grad:      8.3 mmHg AV Mean Grad:      4.2 mmHg LVOT Vmax:         97.80 cm/s LVOT Vmean:        60.850 cm/s LVOT VTI:          0.223 m LVOT/AV VTI ratio: 0.71  AORTA Ao Root diam: 2.70 cm Ao Asc diam:  3.40 cm Ao Desc diam: 2.00 cm  MITRAL VALVE  TRICUSPID VALVE MV Area (PHT): 4.21 cm     TR Peak grad:   39.9 mmHg MV Decel Time: 180 msec     TR Vmax:        316.00 cm/s MR Peak grad: 42.0 mmHg MR Vmax:      324.00 cm/s   SHUNTS MV E velocity: 100.00 cm/s  Systemic VTI:  0.22 m MV A velocity: 89.80 cm/s   Systemic Diam: 1.80 cm MV E/A ratio:  1.11  Sreedhar reddy Madireddy Electronically signed by Alean reddy Madireddy Signature Date/Time: 04/18/2024/5:06:47 PM    Final    MONITORS  LONG TERM MONITOR (3-14 DAYS) 07/18/2019  Narrative Jenna Poole, DOB [birth year], MRN [id number]  [facility]  MONITOR REPORT:    Date of test:                 07/02/2019 Duration of test:           4 days Indication:                    Palpitations Ordering physician:  Lamar JINNY Fitch, MD Referring physician:  Lamar JINNY Fitch, MD   Baseline rhythm: Sinus  Minimum heart rate: 46 BPM.  Average heart rate: 65 BPM.  Maximal heart rate 218 BPM.  Atrial arrhythmia: Multiple episodes nonsustained supraventricular tachycardias.  At long RP tachycardia most likely atrial tachycardia.  Longest episode 3 minutes 9 seconds at rate of 131, fastest episode 12 beats at rate of 218  Ventricular arrhythmia: Infrequent premature ventricular beats  Conduction abnormality: None  Symptoms: None   Conclusion: Multiple runs of supraventricular tachycardia most likely atrial tachycardia  Interpreting  cardiologist: Lamar Fitch, MD Date: 07/20/2019 9:24 AM       ______________________________________________________________________________________________      Risk Assessment/Calculations:             Physical Exam:   VS:  BP 120/70   Pulse 71   Ht 5' 1 (1.549 m)   Wt 142 lb (64.4 kg)   SpO2 93%   BMI 26.83 kg/m    Wt Readings from Last 3 Encounters:  09/17/24 142 lb (64.4 kg)  03/21/24 143 lb (64.9 kg)  02/28/24 143 lb (64.9 kg)    GEN: Appears younger than stated age, well nourished, well developed in no acute distress NECK: No JVD; No carotid bruits CARDIAC: RRR, no murmurs, rubs, gallops RESPIRATORY: mild crackles in the bases, clear elsewhere ABDOMEN: Soft, non-tender, non-distended EXTREMITIES:  No edema; No deformity   ASSESSMENT AND PLAN: .   Shortness of breath/DOE - baseline for her.  We repeated an echocardiogram revealing a slightly elevated PASP, discussed trialing diuretic therapy however she is concerned with her CKD.  Right now, this is baseline for her and she does not want to try any additional measures to help her breathing.  Palpitations/SVT -episode as  outlined above in the HPI which prompted her visit today, she was unaware that she was tachycardic, has been checking her pulse ox at home since and her heart rate is typically in the 60s.  Currently quiescent.  Continue metoprolol  25 mg daily.  Did discuss that if she notices episodes of tachycardia or erratic heart rates on her pulse ox then we could easily arrange for a monitor in the office.  Hypertension-blood pressure is controlled 120/70, continue HCTZ 12.5 mg daily, continue Cozaar  50 mg daily, continue metoprolol  25 mg daily.  CKD IIIb - most recent GFR 34, careful titration of antihypertensive agents and  diuretics.        Dispo: Keep recall with Dr. Krasowski. If she notices irregularities in her pulse ox regarding her HR, she may call the office and we can easily arrange a monitor but she has known SVT.   Signed, Delon JAYSON Hoover, NP

## 2024-09-17 ENCOUNTER — Ambulatory Visit: Attending: Cardiology | Admitting: Cardiology

## 2024-09-17 ENCOUNTER — Encounter: Payer: Self-pay | Admitting: Cardiology

## 2024-09-17 VITALS — BP 120/70 | HR 71 | Ht 61.0 in | Wt 142.0 lb

## 2024-09-17 DIAGNOSIS — R0600 Dyspnea, unspecified: Secondary | ICD-10-CM

## 2024-09-17 DIAGNOSIS — I10 Essential (primary) hypertension: Secondary | ICD-10-CM

## 2024-09-17 DIAGNOSIS — I471 Supraventricular tachycardia, unspecified: Secondary | ICD-10-CM | POA: Diagnosis not present

## 2024-09-17 DIAGNOSIS — R002 Palpitations: Secondary | ICD-10-CM | POA: Diagnosis present

## 2024-09-17 DIAGNOSIS — N1832 Chronic kidney disease, stage 3b: Secondary | ICD-10-CM

## 2024-09-17 NOTE — Patient Instructions (Signed)
 Medication Instructions:  Your physician recommends that you continue on your current medications as directed. Please refer to the Current Medication list given to you today.  *If you need a refill on your cardiac medications before your next appointment, please call your pharmacy*  Lab Work: NONE If you have labs (blood work) drawn today and your tests are completely normal, you will receive your results only by: MyChart Message (if you have MyChart) OR A paper copy in the mail If you have any lab test that is abnormal or we need to change your treatment, we will call you to review the results.  Testing/Procedures: NONE  Follow-Up: At [facility], you and your health needs are our priority.  As part of our continuing mission to provide you with exceptional heart care, our providers are all part of one team.  This team includes your primary Cardiologist (physician) and Advanced Practice Providers or APPs (Physician Assistants and Nurse Practitioners) who all work together to provide you with the care you need, when you need it.  Your next appointment:  May 2026    Provider:   Lamar Fitch, MD    We recommend signing up for the patient portal called MyChart.  Sign up information is provided on this After Visit Summary.  MyChart is used to connect with patients for Virtual Visits (Telemedicine).  Patients are able to view lab/test results, encounter notes, upcoming appointments, etc.  Non-urgent messages can be sent to your provider as well.   To learn more about what you can do with MyChart, go to [email or web address].   Other Instructions
